# Patient Record
Sex: Female | Born: 1935 | Race: Black or African American | Hispanic: No | Smoking: Never smoker
Health system: Southern US, Community
[De-identification: ages and names within clinical notes are randomized; demographics above are authoritative.]

## PROBLEM LIST (undated history)

## (undated) DIAGNOSIS — I972 Postmastectomy lymphedema syndrome: Secondary | ICD-10-CM

## (undated) DIAGNOSIS — D649 Anemia, unspecified: Secondary | ICD-10-CM

## (undated) DIAGNOSIS — D709 Neutropenia, unspecified: Secondary | ICD-10-CM

## (undated) DIAGNOSIS — F32A Depression, unspecified: Secondary | ICD-10-CM

## (undated) DIAGNOSIS — K5790 Diverticulosis of intestine, part unspecified, without perforation or abscess without bleeding: Secondary | ICD-10-CM

## (undated) DIAGNOSIS — R609 Edema, unspecified: Secondary | ICD-10-CM

## (undated) DIAGNOSIS — I872 Venous insufficiency (chronic) (peripheral): Secondary | ICD-10-CM

## (undated) DIAGNOSIS — E785 Hyperlipidemia, unspecified: Secondary | ICD-10-CM

## (undated) DIAGNOSIS — K219 Gastro-esophageal reflux disease without esophagitis: Secondary | ICD-10-CM

## (undated) DIAGNOSIS — F329 Major depressive disorder, single episode, unspecified: Secondary | ICD-10-CM

## (undated) DIAGNOSIS — R945 Abnormal results of liver function studies: Secondary | ICD-10-CM

## (undated) DIAGNOSIS — K5909 Other constipation: Secondary | ICD-10-CM

## (undated) DIAGNOSIS — K449 Diaphragmatic hernia without obstruction or gangrene: Secondary | ICD-10-CM

## (undated) DIAGNOSIS — Z79899 Other long term (current) drug therapy: Secondary | ICD-10-CM

## (undated) DIAGNOSIS — R413 Other amnesia: Secondary | ICD-10-CM

## (undated) DIAGNOSIS — M109 Gout, unspecified: Secondary | ICD-10-CM

## (undated) DIAGNOSIS — I10 Essential (primary) hypertension: Secondary | ICD-10-CM

## (undated) DIAGNOSIS — C50919 Malignant neoplasm of unspecified site of unspecified female breast: Secondary | ICD-10-CM

## (undated) DIAGNOSIS — M949 Disorder of cartilage, unspecified: Secondary | ICD-10-CM

## (undated) DIAGNOSIS — E876 Hypokalemia: Secondary | ICD-10-CM

## (undated) DIAGNOSIS — R32 Unspecified urinary incontinence: Secondary | ICD-10-CM

## (undated) DIAGNOSIS — G4700 Insomnia, unspecified: Secondary | ICD-10-CM

## (undated) DIAGNOSIS — M899 Disorder of bone, unspecified: Secondary | ICD-10-CM

## (undated) DIAGNOSIS — I498 Other specified cardiac arrhythmias: Secondary | ICD-10-CM

## (undated) DIAGNOSIS — K5732 Diverticulitis of large intestine without perforation or abscess without bleeding: Secondary | ICD-10-CM

## (undated) DIAGNOSIS — E039 Hypothyroidism, unspecified: Secondary | ICD-10-CM

## (undated) HISTORY — DX: Gastro-esophageal reflux disease without esophagitis: K21.9

## (undated) HISTORY — DX: Diaphragmatic hernia without obstruction or gangrene: K44.9

## (undated) HISTORY — DX: Major depressive disorder, single episode, unspecified: F32.9

## (undated) HISTORY — DX: Malignant neoplasm of unspecified site of unspecified female breast: C50.919

## (undated) HISTORY — DX: Hypokalemia: E87.6

## (undated) HISTORY — DX: Anemia, unspecified: D64.9

## (undated) HISTORY — DX: Other specified cardiac arrhythmias: I49.8

## (undated) HISTORY — DX: Insomnia, unspecified: G47.00

## (undated) HISTORY — PX: VAGINAL HYSTERECTOMY: SHX2639

## (undated) HISTORY — DX: Other constipation: K59.09

## (undated) HISTORY — DX: Gout, unspecified: M10.9

## (undated) HISTORY — DX: Postmastectomy lymphedema syndrome: I97.2

## (undated) HISTORY — DX: Abnormal results of liver function studies: R94.5

## (undated) HISTORY — DX: Other long term (current) drug therapy: Z79.899

## (undated) HISTORY — PX: OTHER SURGICAL HISTORY: SHX169

## (undated) HISTORY — DX: Depression, unspecified: F32.A

## (undated) HISTORY — DX: Other amnesia: R41.3

## (undated) HISTORY — DX: Hyperlipidemia, unspecified: E78.5

## (undated) HISTORY — DX: Disorder of cartilage, unspecified: M94.9

## (undated) HISTORY — DX: Other disorders of bilirubin metabolism: E80.6

## (undated) HISTORY — DX: Diverticulosis of intestine, part unspecified, without perforation or abscess without bleeding: K57.90

## (undated) HISTORY — DX: Diverticulitis of large intestine without perforation or abscess without bleeding: K57.32

## (undated) HISTORY — DX: Essential (primary) hypertension: I10

## (undated) HISTORY — DX: Neutropenia, unspecified: D70.9

## (undated) HISTORY — PX: ABDOMINAL HYSTERECTOMY: SHX81

## (undated) HISTORY — DX: Unspecified urinary incontinence: R32

## (undated) HISTORY — DX: Venous insufficiency (chronic) (peripheral): I87.2

## (undated) HISTORY — DX: Disorder of bone, unspecified: M89.9

## (undated) HISTORY — DX: Edema, unspecified: R60.9

## (undated) HISTORY — DX: Hypothyroidism, unspecified: E03.9

---

## 1998-02-20 ENCOUNTER — Other Ambulatory Visit: Admission: RE | Admit: 1998-02-20 | Discharge: 1998-02-20 | Payer: Self-pay | Admitting: Internal Medicine

## 1998-05-11 ENCOUNTER — Ambulatory Visit (HOSPITAL_COMMUNITY): Admission: RE | Admit: 1998-05-11 | Discharge: 1998-05-11 | Payer: Self-pay | Admitting: Internal Medicine

## 1998-10-06 DIAGNOSIS — C50919 Malignant neoplasm of unspecified site of unspecified female breast: Secondary | ICD-10-CM

## 1998-10-06 HISTORY — DX: Malignant neoplasm of unspecified site of unspecified female breast: C50.919

## 1998-11-29 ENCOUNTER — Encounter: Payer: Self-pay | Admitting: Internal Medicine

## 1998-11-29 ENCOUNTER — Ambulatory Visit (HOSPITAL_COMMUNITY): Admission: RE | Admit: 1998-11-29 | Discharge: 1998-11-29 | Payer: Self-pay | Admitting: Internal Medicine

## 1999-07-16 ENCOUNTER — Other Ambulatory Visit: Admission: RE | Admit: 1999-07-16 | Discharge: 1999-07-16 | Payer: Self-pay | Admitting: Internal Medicine

## 1999-09-04 ENCOUNTER — Other Ambulatory Visit: Admission: RE | Admit: 1999-09-04 | Discharge: 1999-09-04 | Payer: Self-pay | Admitting: Radiology

## 1999-09-16 ENCOUNTER — Ambulatory Visit (HOSPITAL_COMMUNITY): Admission: RE | Admit: 1999-09-16 | Discharge: 1999-09-16 | Payer: Self-pay | Admitting: General Surgery

## 1999-09-16 ENCOUNTER — Encounter (HOSPITAL_BASED_OUTPATIENT_CLINIC_OR_DEPARTMENT_OTHER): Payer: Self-pay | Admitting: General Surgery

## 1999-09-17 ENCOUNTER — Ambulatory Visit (HOSPITAL_COMMUNITY): Admission: RE | Admit: 1999-09-17 | Discharge: 1999-09-17 | Payer: Self-pay | Admitting: General Surgery

## 1999-09-17 ENCOUNTER — Encounter (HOSPITAL_BASED_OUTPATIENT_CLINIC_OR_DEPARTMENT_OTHER): Payer: Self-pay | Admitting: General Surgery

## 1999-09-23 ENCOUNTER — Encounter (HOSPITAL_BASED_OUTPATIENT_CLINIC_OR_DEPARTMENT_OTHER): Payer: Self-pay | Admitting: General Surgery

## 1999-09-25 ENCOUNTER — Encounter (INDEPENDENT_AMBULATORY_CARE_PROVIDER_SITE_OTHER): Payer: Self-pay

## 1999-09-25 ENCOUNTER — Ambulatory Visit (HOSPITAL_COMMUNITY): Admission: RE | Admit: 1999-09-25 | Discharge: 1999-09-26 | Payer: Self-pay | Admitting: General Surgery

## 1999-10-14 ENCOUNTER — Encounter: Admission: RE | Admit: 1999-10-14 | Discharge: 1999-11-08 | Payer: Self-pay | Admitting: General Surgery

## 1999-11-01 ENCOUNTER — Encounter: Admission: RE | Admit: 1999-11-01 | Discharge: 1999-11-01 | Payer: Self-pay | Admitting: Hematology and Oncology

## 1999-11-01 ENCOUNTER — Encounter: Payer: Self-pay | Admitting: Hematology and Oncology

## 2000-10-13 ENCOUNTER — Ambulatory Visit (HOSPITAL_COMMUNITY): Admission: RE | Admit: 2000-10-13 | Discharge: 2000-10-13 | Payer: Self-pay | Admitting: Hematology and Oncology

## 2000-10-13 ENCOUNTER — Encounter: Payer: Self-pay | Admitting: Hematology and Oncology

## 2002-03-17 ENCOUNTER — Encounter: Payer: Self-pay | Admitting: Hematology and Oncology

## 2002-03-17 ENCOUNTER — Ambulatory Visit (HOSPITAL_COMMUNITY): Admission: RE | Admit: 2002-03-17 | Discharge: 2002-03-17 | Payer: Self-pay | Admitting: Hematology and Oncology

## 2002-04-21 ENCOUNTER — Encounter: Payer: Self-pay | Admitting: Internal Medicine

## 2002-04-21 ENCOUNTER — Ambulatory Visit (HOSPITAL_COMMUNITY): Admission: RE | Admit: 2002-04-21 | Discharge: 2002-04-21 | Payer: Self-pay | Admitting: Internal Medicine

## 2002-09-13 ENCOUNTER — Encounter: Payer: Self-pay | Admitting: Internal Medicine

## 2002-09-13 ENCOUNTER — Ambulatory Visit (HOSPITAL_COMMUNITY): Admission: RE | Admit: 2002-09-13 | Discharge: 2002-09-13 | Payer: Self-pay | Admitting: Internal Medicine

## 2004-09-27 ENCOUNTER — Ambulatory Visit: Payer: Self-pay | Admitting: Oncology

## 2005-10-22 ENCOUNTER — Ambulatory Visit: Payer: Self-pay | Admitting: Oncology

## 2006-01-01 ENCOUNTER — Encounter: Admission: RE | Admit: 2006-01-01 | Discharge: 2006-01-01 | Payer: Self-pay | Admitting: *Deleted

## 2006-10-06 HISTORY — PX: MASTECTOMY: SHX3

## 2006-10-19 ENCOUNTER — Ambulatory Visit: Payer: Self-pay | Admitting: Oncology

## 2006-10-21 LAB — CBC WITH DIFFERENTIAL/PLATELET
EOS%: 5.1 % (ref 0.0–7.0)
MCH: 28.1 pg (ref 26.0–34.0)
MCV: 85.5 fL (ref 81.0–101.0)
MONO%: 13.8 % — ABNORMAL HIGH (ref 0.0–13.0)
RBC: 4.56 10*6/uL (ref 3.70–5.32)
RDW: 15 % — ABNORMAL HIGH (ref 11.3–14.5)

## 2006-10-21 LAB — COMPREHENSIVE METABOLIC PANEL
AST: 20 U/L (ref 0–37)
Albumin: 3.8 g/dL (ref 3.5–5.2)
Alkaline Phosphatase: 95 U/L (ref 39–117)
BUN: 12 mg/dL (ref 6–23)
Potassium: 3.6 mEq/L (ref 3.5–5.3)
Sodium: 145 mEq/L (ref 135–145)
Total Protein: 6.2 g/dL (ref 6.0–8.3)

## 2007-03-16 ENCOUNTER — Encounter: Admission: RE | Admit: 2007-03-16 | Discharge: 2007-03-29 | Payer: Self-pay | Admitting: Oncology

## 2007-10-27 ENCOUNTER — Ambulatory Visit: Payer: Self-pay | Admitting: Oncology

## 2007-10-29 LAB — CBC WITH DIFFERENTIAL/PLATELET
BASO%: 0.3 % (ref 0.0–2.0)
Basophils Absolute: 0 10*3/uL (ref 0.0–0.1)
HCT: 38 % (ref 34.8–46.6)
LYMPH%: 25.9 % (ref 14.0–48.0)
MCHC: 33.8 g/dL (ref 32.0–36.0)
MONO#: 0.5 10*3/uL (ref 0.1–0.9)
NEUT%: 55.1 % (ref 39.6–76.8)
Platelets: 229 10*3/uL (ref 145–400)
WBC: 3.6 10*3/uL — ABNORMAL LOW (ref 3.9–10.0)

## 2007-10-29 LAB — COMPREHENSIVE METABOLIC PANEL
ALT: 20 U/L (ref 0–35)
BUN: 7 mg/dL (ref 6–23)
CO2: 27 mEq/L (ref 19–32)
Creatinine, Ser: 0.94 mg/dL (ref 0.40–1.20)
Glucose, Bld: 84 mg/dL (ref 70–99)
Total Bilirubin: 1.5 mg/dL — ABNORMAL HIGH (ref 0.3–1.2)

## 2007-10-29 LAB — LACTATE DEHYDROGENASE: LDH: 140 U/L (ref 94–250)

## 2008-06-26 ENCOUNTER — Encounter: Payer: Self-pay | Admitting: Gastroenterology

## 2008-07-30 ENCOUNTER — Emergency Department (HOSPITAL_COMMUNITY): Admission: EM | Admit: 2008-07-30 | Discharge: 2008-07-30 | Payer: Self-pay | Admitting: Emergency Medicine

## 2008-08-15 ENCOUNTER — Ambulatory Visit: Payer: Self-pay | Admitting: Gastroenterology

## 2008-08-15 DIAGNOSIS — K219 Gastro-esophageal reflux disease without esophagitis: Secondary | ICD-10-CM

## 2008-08-15 DIAGNOSIS — R634 Abnormal weight loss: Secondary | ICD-10-CM

## 2008-08-15 DIAGNOSIS — R1032 Left lower quadrant pain: Secondary | ICD-10-CM

## 2008-09-15 ENCOUNTER — Ambulatory Visit: Payer: Self-pay | Admitting: Gastroenterology

## 2008-09-15 ENCOUNTER — Encounter: Payer: Self-pay | Admitting: Gastroenterology

## 2008-09-18 ENCOUNTER — Encounter: Payer: Self-pay | Admitting: Gastroenterology

## 2008-10-17 ENCOUNTER — Ambulatory Visit: Payer: Self-pay | Admitting: Gastroenterology

## 2008-10-17 DIAGNOSIS — K2990 Gastroduodenitis, unspecified, without bleeding: Secondary | ICD-10-CM

## 2008-10-17 DIAGNOSIS — K2970 Gastritis, unspecified, without bleeding: Secondary | ICD-10-CM | POA: Insufficient documentation

## 2008-10-26 ENCOUNTER — Ambulatory Visit: Payer: Self-pay | Admitting: Oncology

## 2008-10-30 LAB — COMPREHENSIVE METABOLIC PANEL
ALT: 17 U/L (ref 0–35)
AST: 19 U/L (ref 0–37)
Albumin: 3.7 g/dL (ref 3.5–5.2)
BUN: 11 mg/dL (ref 6–23)
Calcium: 8.8 mg/dL (ref 8.4–10.5)
Chloride: 107 mEq/L (ref 96–112)
Potassium: 3.6 mEq/L (ref 3.5–5.3)

## 2008-10-30 LAB — CBC WITH DIFFERENTIAL/PLATELET
Basophils Absolute: 0 10*3/uL (ref 0.0–0.1)
EOS%: 4.1 % (ref 0.0–7.0)
HCT: 38.6 % (ref 34.8–46.6)
HGB: 13.1 g/dL (ref 11.6–15.9)
LYMPH%: 29.5 % (ref 14.0–48.0)
MCH: 29.1 pg (ref 26.0–34.0)
NEUT%: 53.9 % (ref 39.6–76.8)
Platelets: 223 10*3/uL (ref 145–400)
lymph#: 1.1 10*3/uL (ref 0.9–3.3)

## 2008-10-30 LAB — MORPHOLOGY: PLT EST: ADEQUATE

## 2009-03-14 ENCOUNTER — Emergency Department (HOSPITAL_COMMUNITY): Admission: EM | Admit: 2009-03-14 | Discharge: 2009-03-15 | Payer: Self-pay | Admitting: Emergency Medicine

## 2009-03-21 ENCOUNTER — Emergency Department (HOSPITAL_COMMUNITY): Admission: EM | Admit: 2009-03-21 | Discharge: 2009-03-21 | Payer: Self-pay | Admitting: Emergency Medicine

## 2009-05-25 ENCOUNTER — Encounter: Admission: RE | Admit: 2009-05-25 | Discharge: 2009-05-25 | Payer: Self-pay | Admitting: Internal Medicine

## 2009-06-01 ENCOUNTER — Encounter: Admission: RE | Admit: 2009-06-01 | Discharge: 2009-06-01 | Payer: Self-pay | Admitting: Internal Medicine

## 2009-10-24 ENCOUNTER — Ambulatory Visit: Payer: Self-pay | Admitting: Oncology

## 2009-10-26 LAB — CBC WITH DIFFERENTIAL/PLATELET
BASO%: 0.3 % (ref 0.0–2.0)
Basophils Absolute: 0 10*3/uL (ref 0.0–0.1)
Eosinophils Absolute: 0.2 10*3/uL (ref 0.0–0.5)
HGB: 13.1 g/dL (ref 11.6–15.9)
MCH: 27.2 pg (ref 25.1–34.0)
MCHC: 32.3 g/dL (ref 31.5–36.0)
MCV: 84.2 fL (ref 79.5–101.0)
MONO#: 0.5 10*3/uL (ref 0.1–0.9)
MONO%: 13 % (ref 0.0–14.0)
RDW: 15.2 % — ABNORMAL HIGH (ref 11.2–14.5)
lymph#: 1.2 10*3/uL (ref 0.9–3.3)

## 2009-10-26 LAB — COMPREHENSIVE METABOLIC PANEL
Alkaline Phosphatase: 69 U/L (ref 39–117)
CO2: 27 mEq/L (ref 19–32)
Calcium: 9.2 mg/dL (ref 8.4–10.5)
Chloride: 106 mEq/L (ref 96–112)
Sodium: 143 mEq/L (ref 135–145)
Total Protein: 6.3 g/dL (ref 6.0–8.3)

## 2010-05-25 LAB — HM DEXA SCAN

## 2011-01-13 LAB — POTASSIUM
Potassium: 2.7 mEq/L — CL (ref 3.5–5.1)
Potassium: 3 mEq/L — ABNORMAL LOW (ref 3.5–5.1)

## 2011-01-13 LAB — POCT I-STAT, CHEM 8
BUN: 9 mg/dL (ref 6–23)
Calcium, Ion: 1.06 mmol/L — ABNORMAL LOW (ref 1.12–1.32)
Chloride: 103 mEq/L (ref 96–112)
Creatinine, Ser: 1.3 mg/dL — ABNORMAL HIGH (ref 0.4–1.2)
Glucose, Bld: 167 mg/dL — ABNORMAL HIGH (ref 70–99)
HCT: 40 % (ref 36.0–46.0)
Hemoglobin: 13.6 g/dL (ref 12.0–15.0)
Potassium: 2.7 mEq/L — CL (ref 3.5–5.1)
Sodium: 141 mEq/L (ref 135–145)

## 2011-01-13 LAB — DIFFERENTIAL
Basophils Relative: 0 % (ref 0–1)
Eosinophils Absolute: 0.2 10*3/uL (ref 0.0–0.7)
Lymphocytes Relative: 37 % (ref 12–46)
Neutrophils Relative %: 47 % (ref 43–77)

## 2011-01-13 LAB — CBC: Hemoglobin: 13.1 g/dL (ref 12.0–15.0)

## 2011-01-13 LAB — PROTIME-INR: Prothrombin Time: 13.3 seconds (ref 11.6–15.2)

## 2011-01-13 LAB — TYPE AND SCREEN: Antibody Screen: NEGATIVE

## 2011-02-07 ENCOUNTER — Emergency Department (HOSPITAL_COMMUNITY)
Admission: EM | Admit: 2011-02-07 | Discharge: 2011-02-08 | Disposition: A | Discharge status: Home / Self Care | Payer: Medicare Other | Attending: Emergency Medicine | Admitting: Emergency Medicine

## 2011-02-07 ENCOUNTER — Emergency Department (HOSPITAL_COMMUNITY): Payer: Medicare Other

## 2011-02-07 DIAGNOSIS — R1110 Vomiting, unspecified: Secondary | ICD-10-CM | POA: Insufficient documentation

## 2011-02-07 DIAGNOSIS — K5900 Constipation, unspecified: Secondary | ICD-10-CM | POA: Insufficient documentation

## 2011-02-07 DIAGNOSIS — Z853 Personal history of malignant neoplasm of breast: Secondary | ICD-10-CM | POA: Insufficient documentation

## 2011-02-07 DIAGNOSIS — I10 Essential (primary) hypertension: Secondary | ICD-10-CM | POA: Insufficient documentation

## 2011-02-07 DIAGNOSIS — R109 Unspecified abdominal pain: Secondary | ICD-10-CM | POA: Insufficient documentation

## 2011-02-07 LAB — OCCULT BLOOD, POC DEVICE: Fecal Occult Bld: NEGATIVE

## 2011-03-07 ENCOUNTER — Ambulatory Visit (INDEPENDENT_AMBULATORY_CARE_PROVIDER_SITE_OTHER): Payer: Medicare Other | Admitting: Gastroenterology

## 2011-03-07 ENCOUNTER — Encounter: Payer: Self-pay | Admitting: Gastroenterology

## 2011-03-07 VITALS — BP 144/68 | HR 76 | Ht 65.5 in | Wt 132.2 lb

## 2011-03-07 DIAGNOSIS — K5900 Constipation, unspecified: Secondary | ICD-10-CM

## 2011-03-07 NOTE — Progress Notes (Signed)
History of Present Illness: This is an 77 year old female last seen in the past. She is by herself today and relates problems with worsening constipation that has not been helped by recommended laxatives and stool softeners. She has a long history of constipation however her symptoms have gradually worsened over the past year. She can provide limited history. I have office notes over several months from her referring physician describing ongoing problems with constipation. She states that she has tried MiraLax and sorbitol on an as-needed basis but has not used these medications on a scheduled regimen. Stool softener has also been tried. She uses enemas on occasion. She states she had a colonoscopy in the 1990s that was normal. Recent abdominal film shows retained stool and gas. Recent rectal examination and stool Hemoccults were negative by her primary physician. She denies change in stool caliber, melena, hematochezia, abdominal pain, nausea, vomiting.   Current Medications, Allergies, Past Medical History, Past Surgical History, Family History and Social History were reviewed in [employer] record.  Physical Exam: General: Well developed , well nourished,  elderly female, no acute distress Head: Normocephalic and atraumatic Eyes:  sclerae anicteric, EOMI Ears: Normal auditory acuity Mouth: No deformity or lesions Lungs: Clear throughout to auscultation Heart: Regular rate and rhythm; no murmurs, rubs or bruits Abdomen: Soft, non tender and non distended. No masses, hepatosplenomegaly or hernias noted. Normal Bowel sounds Rectal: deferred with exam on 02/05/2011 her primary physician showing no lesions, normal sphincter tone, no masses and Hemoccult negative hard stool in the vault and  Musculoskeletal: Symmetrical with no gross deformities  Pulses:  Normal pulses noted Extremities: No clubbing, cyanosis, edema or deformities noted Neurological: Alert oriented x 4, grossly  nonfocal Psychological:  Alert and cooperative. Normal mood and affect  Assessment and Recommendations:  1. Gradually worsening chronic constipation with Hemoccult negative stool and no lesions on rectal examination. Abdominal films consistent with constipation as well. She would be best served by a regularly scheduled laxative regimen. Begin with MiraLax twice daily may titrate up to four times daily or down to once daily for adequate relief of constipation. She may use sorbitol and enemas as needed if the above scheduled Miralax regimen is not successful. If her constipation cannot be adequately controlled consider a barium enema or colonoscopy however the patient would like to avoid a colonoscopy or barium enema, if at all possible, and I feel this is reasonable.

## 2011-03-07 NOTE — Patient Instructions (Addendum)
Start taking Miralax 17 grams in 8 oz of water twice daily every day. Continue Sorbitol solution as needed for constipation. cc: Providence Crosby, MD

## 2011-05-26 ENCOUNTER — Other Ambulatory Visit: Payer: Self-pay | Admitting: Internal Medicine

## 2011-05-26 DIAGNOSIS — G8929 Other chronic pain: Secondary | ICD-10-CM

## 2011-05-26 DIAGNOSIS — R634 Abnormal weight loss: Secondary | ICD-10-CM

## 2011-05-26 DIAGNOSIS — R109 Unspecified abdominal pain: Secondary | ICD-10-CM

## 2011-05-26 DIAGNOSIS — E039 Hypothyroidism, unspecified: Secondary | ICD-10-CM

## 2011-05-29 ENCOUNTER — Ambulatory Visit
Admission: RE | Admit: 2011-05-29 | Discharge: 2011-05-29 | Disposition: A | Discharge status: Home / Self Care | Payer: Medicare Other | Source: Ambulatory Visit | Attending: Internal Medicine | Admitting: Internal Medicine

## 2011-05-29 ENCOUNTER — Other Ambulatory Visit: Payer: Self-pay | Admitting: Internal Medicine

## 2011-05-29 DIAGNOSIS — R188 Other ascites: Secondary | ICD-10-CM

## 2011-05-29 DIAGNOSIS — G8929 Other chronic pain: Secondary | ICD-10-CM

## 2011-05-29 DIAGNOSIS — R109 Unspecified abdominal pain: Secondary | ICD-10-CM

## 2011-05-29 DIAGNOSIS — E039 Hypothyroidism, unspecified: Secondary | ICD-10-CM

## 2011-05-29 DIAGNOSIS — R634 Abnormal weight loss: Secondary | ICD-10-CM

## 2011-06-05 ENCOUNTER — Ambulatory Visit
Admission: RE | Admit: 2011-06-05 | Discharge: 2011-06-05 | Disposition: A | Discharge status: Home / Self Care | Payer: Medicare Other | Source: Ambulatory Visit | Attending: Internal Medicine | Admitting: Internal Medicine

## 2011-06-05 DIAGNOSIS — R188 Other ascites: Secondary | ICD-10-CM

## 2011-06-05 MED ORDER — GADOBENATE DIMEGLUMINE 529 MG/ML IV SOLN
12.0000 mL | Freq: Once | INTRAVENOUS | Status: AC | PRN
  Administered 2011-06-05: 12 mL via INTRAVENOUS

## 2011-07-08 LAB — URINALYSIS, ROUTINE W REFLEX MICROSCOPIC
Bilirubin Urine: NEGATIVE
Glucose, UA: NEGATIVE
Hgb urine dipstick: NEGATIVE
Ketones, ur: NEGATIVE
Nitrite: NEGATIVE
Protein, ur: NEGATIVE
Specific Gravity, Urine: 1.029
Urobilinogen, UA: 0.2
pH: 6

## 2011-07-08 LAB — COMPREHENSIVE METABOLIC PANEL
ALT: 27
Alkaline Phosphatase: 70
BUN: 11
CO2: 26
Chloride: 104
Glucose, Bld: 113 — ABNORMAL HIGH
Potassium: 3.2 — ABNORMAL LOW
Sodium: 138
Total Bilirubin: 1

## 2011-07-08 LAB — URINE CULTURE

## 2011-07-08 LAB — COMPREHENSIVE METABOLIC PANEL WITH GFR
AST: 31
Albumin: 3.5
Calcium: 9.2
Creatinine, Ser: 0.99
GFR calc Af Amer: >60
GFR calc non Af Amer: 53 — ABNORMAL LOW
Total Protein: 6.4

## 2011-07-08 LAB — CBC
HCT: 41
Hemoglobin: 13.8
MCHC: 33.6
MCV: 85.7
Platelets: 211
RBC: 4.79
RDW: 15.4
WBC: 4.1

## 2011-07-08 LAB — DIFFERENTIAL
Basophils Absolute: 0
Basophils Relative: 1
Eosinophils Absolute: 0.1
Eosinophils Relative: 3
Lymphocytes Relative: 28
Lymphs Abs: 1.1
Monocytes Absolute: 0.4
Monocytes Relative: 9
Neutro Abs: 2.4
Neutrophils Relative %: 60

## 2011-07-08 LAB — LIPASE, BLOOD: Lipase: 22

## 2011-07-08 LAB — URINE MICROSCOPIC-ADD ON

## 2011-08-05 ENCOUNTER — Other Ambulatory Visit: Payer: Self-pay | Admitting: Gastroenterology

## 2011-08-08 ENCOUNTER — Ambulatory Visit
Admission: RE | Admit: 2011-08-08 | Discharge: 2011-08-08 | Disposition: A | Discharge status: Home / Self Care | Payer: Medicare Other | Source: Ambulatory Visit | Attending: Gastroenterology | Admitting: Gastroenterology

## 2011-08-08 MED ORDER — IOHEXOL 300 MG/ML  SOLN
100.0000 mL | Freq: Once | INTRAMUSCULAR | Status: AC | PRN
  Administered 2011-08-08: 100 mL via INTRAVENOUS

## 2011-08-25 ENCOUNTER — Other Ambulatory Visit: Payer: Self-pay | Admitting: Endocrinology

## 2011-08-25 DIAGNOSIS — E049 Nontoxic goiter, unspecified: Secondary | ICD-10-CM

## 2011-08-27 ENCOUNTER — Ambulatory Visit
Admission: RE | Admit: 2011-08-27 | Discharge: 2011-08-27 | Disposition: A | Discharge status: Home / Self Care | Payer: Medicare Other | Source: Ambulatory Visit | Attending: Endocrinology | Admitting: Endocrinology

## 2011-08-27 DIAGNOSIS — E049 Nontoxic goiter, unspecified: Secondary | ICD-10-CM

## 2011-09-17 ENCOUNTER — Other Ambulatory Visit: Payer: Self-pay | Admitting: Endocrinology

## 2011-09-17 DIAGNOSIS — E049 Nontoxic goiter, unspecified: Secondary | ICD-10-CM

## 2011-11-06 ENCOUNTER — Telehealth: Payer: Self-pay | Admitting: *Deleted

## 2011-11-06 NOTE — Telephone Encounter (Signed)
Faxed  Order from for non sillicone breast form to [facility].

## 2011-12-03 ENCOUNTER — Other Ambulatory Visit: Payer: Self-pay | Admitting: Internal Medicine

## 2011-12-05 ENCOUNTER — Other Ambulatory Visit: Payer: Medicare Other

## 2011-12-11 ENCOUNTER — Ambulatory Visit
Admission: RE | Admit: 2011-12-11 | Discharge: 2011-12-11 | Disposition: A | Discharge status: Home / Self Care | Payer: Medicare Other | Source: Ambulatory Visit | Attending: Internal Medicine | Admitting: Internal Medicine

## 2012-02-16 ENCOUNTER — Other Ambulatory Visit: Payer: Medicare Other

## 2012-09-13 ENCOUNTER — Other Ambulatory Visit: Payer: Self-pay

## 2012-09-13 ENCOUNTER — Emergency Department (INDEPENDENT_AMBULATORY_CARE_PROVIDER_SITE_OTHER): Payer: Medicare Other

## 2012-09-13 ENCOUNTER — Encounter (HOSPITAL_COMMUNITY): Payer: Self-pay | Admitting: Emergency Medicine

## 2012-09-13 ENCOUNTER — Emergency Department (INDEPENDENT_AMBULATORY_CARE_PROVIDER_SITE_OTHER)
Admission: EM | Admit: 2012-09-13 | Discharge: 2012-09-13 | Disposition: A | Discharge status: Home / Self Care | Payer: Medicare Other | Source: Home / Self Care | Attending: Emergency Medicine | Admitting: Emergency Medicine

## 2012-09-13 DIAGNOSIS — R05 Cough: Secondary | ICD-10-CM

## 2012-09-13 DIAGNOSIS — T44905A Adverse effect of unspecified drugs primarily affecting the autonomic nervous system, initial encounter: Secondary | ICD-10-CM

## 2012-09-13 DIAGNOSIS — E876 Hypokalemia: Secondary | ICD-10-CM

## 2012-09-13 DIAGNOSIS — R058 Other specified cough: Secondary | ICD-10-CM

## 2012-09-13 LAB — POCT I-STAT, CHEM 8
Calcium, Ion: 1.23 mmol/L (ref 1.13–1.30)
Chloride: 102 mEq/L (ref 96–112)
HCT: 45 % (ref 36.0–46.0)
Potassium: 3.3 mEq/L — ABNORMAL LOW (ref 3.5–5.1)
Sodium: 143 mEq/L (ref 135–145)

## 2012-09-13 MED ORDER — POTASSIUM CHLORIDE CRYS ER 10 MEQ PO TBCR: 10.0000 meq | EXTENDED_RELEASE_TABLET | Freq: Two times a day (BID) | ORAL | Status: DC

## 2012-09-13 MED ORDER — BENZONATATE 200 MG PO CAPS: 200.0000 mg | ORAL_CAPSULE | Freq: Three times a day (TID) | ORAL | Status: DC | PRN

## 2012-09-13 NOTE — ED Notes (Signed)
Pt is here for persistent cough x2 months... Sx include: chest discomfort due to cough... Denies: fevers, vomiting, nauseas, diarrhea... She is alert w/no signs of distress.

## 2012-09-13 NOTE — ED Provider Notes (Signed)
Chief Complaint  Patient presents with  . Cough    History of Present Illness:   The patient is an 78 year old female who has had a 2 month history of a dry cough. She occasionally has a little bit of chest tightness and describes some wheezing. She is mildly short of breath with exertion. She denies any chest pain except she has some upper sternal pain whenever she coughs. She has some rhinorrhea and postnasal drip. She denies any nasal congestion, itching of the nose, sneezing, or itchy, watery eyes. She's had no postnasal drainage. She denies any history of asthma. She has had no indigestion, reflux, waterbrash. She denies any ankle swelling, PND, orthopnea. There is no history of cardiac disease. The patient was started on lisinopril about 3 months ago.  Review of Systems:  Other than noted above, the patient denies any of the following symptoms: Systemic:  No fevers, chills, sweats, weight loss or gain, fatigue, or tiredness. ENT:  No nasal congestion, sneezing, itching, postnasal drip, sinus pressure, headache, sore throat, or hoarseness. Lungs:  No wheezing, shortness of breath, chest tightness or congestion. Heart:  No chest pain, tightness, pressure, PND, orthopnea, or ankle edema. GI:  No indigestion, heartburn, waterbrash, burping, abdominal pain, nausea, or vomiting.  PMFSH:  Past medical history, family history, social history, meds, and allergies were reviewed.  Specifically, there is no history of asthma, allergies, reflux esophagitis or cigarette smoking.   Physical Exam:   Vital signs:  BP 171/89  Pulse 88  Temp 97.2 F (36.2 C) (Oral)  Resp 20  SpO2 100% General:  Alert and oriented.  In no distress.  Skin warm and dry. ENT: TMs and ear canals normal.  Nasal mucosa normal, without drainage.  Pharynx clear without exudate or drainage.  No intraoral lesions. Neck:  No adenopathy, tenderness or mass.  No JVD. Lungs:  No respiratory distress.  Breath sounds clear and equal  bilaterally.  No wheezes, rales or rhonchi. Heart:  Regular rhythm, no gallops or murmers.  No pedal edema. Abdomon:  Soft and nontender.  No organomegaly or mass.  Results for orders placed during the hospital encounter of 09/13/12  POCT I-STAT, CHEM 8      Component Value Range   Sodium 143  135 - 145 mEq/L   Potassium 3.3 (*) 3.5 - 5.1 mEq/L   Chloride 102  96 - 112 mEq/L   BUN 11  6 - 23 mg/dL   Creatinine, Ser 4.09  0.50 - 1.10 mg/dL   Glucose, Bld 91  70 - 99 mg/dL   Calcium, Ion 8.11  9.14 - 1.30 mmol/L   TCO2 29  0 - 100 mmol/L   Hemoglobin 15.3 (*) 12.0 - 15.0 g/dL   HCT 78.2  95.6 - 21.3 %   Radiology:  Dg Chest 2 View  09/13/2012  *RADIOLOGY REPORT*  Clinical Data: Cough, shortness of breath  CHEST - 2 VIEW  Comparison: None.  Findings: The lungs are essentially clear.  No focal consolidation. No pleural effusion or pneumothorax.  The heart is top normal in size.  Visualized osseous structures are within normal limits.  Postsurgical changes in the left upper chest wall/breast.  IMPRESSION: No evidence of acute cardiopulmonary disease.   Original Report Authenticated By: Charline Bills, M.D.     Date: 09/13/2012  Rate: 72  Rhythm: sinus arrhythmia  QRS Axis: normal  Intervals: normal  ST/T Wave abnormalities: normal  Conduction Disutrbances:none  Narrative Interpretation: Sinus rhythm with premature atrial complexes, minor  nonspecific ST T wave changes which are unchanged since last tracing, since last tracing PACs are seen.  Old EKG Reviewed: unchanged  Assessment:  The primary encounter diagnosis was Cough due to ACE inhibitor. A diagnosis of Hypokalemia was also pertinent to this visit.  Plan:   1.  The following meds were prescribed:   New Prescriptions   BENZONATATE (TESSALON) 200 MG CAPSULE    Take 1 capsule (200 mg total) by mouth 3 (three) times daily as needed for cough.   POTASSIUM CHLORIDE (KLOR-CON M10) 10 MEQ TABLET    Take 1 tablet (10 mEq total) by  mouth 2 (two) times daily.   The patient was told to stop her lisinopril and return again in 2 weeks for a followup on her blood pressure.  2.  The patient was instructed in symptomatic care and handouts were given. 3.  The patient was told to return if becoming worse in any way, if no better in 3 or 4 days, and given some red flag symptoms that would indicate earlier return.     Reuben Likes, MD 09/13/12 [phone]

## 2012-11-03 ENCOUNTER — Other Ambulatory Visit: Payer: Self-pay | Admitting: Internal Medicine

## 2012-11-03 DIAGNOSIS — R634 Abnormal weight loss: Secondary | ICD-10-CM

## 2012-11-03 DIAGNOSIS — N289 Disorder of kidney and ureter, unspecified: Secondary | ICD-10-CM

## 2012-11-03 DIAGNOSIS — R630 Anorexia: Secondary | ICD-10-CM

## 2012-11-08 ENCOUNTER — Ambulatory Visit
Admission: RE | Admit: 2012-11-08 | Discharge: 2012-11-08 | Disposition: A | Discharge status: Home / Self Care | Payer: Medicare Other | Source: Ambulatory Visit | Attending: Internal Medicine | Admitting: Internal Medicine

## 2012-11-08 DIAGNOSIS — N289 Disorder of kidney and ureter, unspecified: Secondary | ICD-10-CM

## 2012-11-08 DIAGNOSIS — R634 Abnormal weight loss: Secondary | ICD-10-CM

## 2012-11-08 DIAGNOSIS — R630 Anorexia: Secondary | ICD-10-CM

## 2012-11-25 LAB — HM MAMMOGRAPHY: HM Mammogram: NEGATIVE

## 2013-02-09 ENCOUNTER — Other Ambulatory Visit: Payer: Self-pay | Admitting: Internal Medicine

## 2013-02-24 ENCOUNTER — Encounter: Payer: Self-pay | Admitting: *Deleted

## 2013-02-24 ENCOUNTER — Ambulatory Visit: Payer: Medicare Other

## 2013-02-24 ENCOUNTER — Other Ambulatory Visit: Payer: Self-pay | Admitting: Geriatric Medicine

## 2013-02-24 DIAGNOSIS — E785 Hyperlipidemia, unspecified: Secondary | ICD-10-CM

## 2013-02-24 DIAGNOSIS — I10 Essential (primary) hypertension: Secondary | ICD-10-CM

## 2013-02-24 DIAGNOSIS — D649 Anemia, unspecified: Secondary | ICD-10-CM

## 2013-02-24 DIAGNOSIS — E039 Hypothyroidism, unspecified: Secondary | ICD-10-CM

## 2013-02-25 LAB — COMPREHENSIVE METABOLIC PANEL
ALT: 13 IU/L (ref 0–32)
AST: 35 IU/L (ref 0–40)
Albumin/Globulin Ratio: 1.5 (ref 1.1–2.5)
Albumin: 3.4 g/dL (ref 3.2–4.6)
Alkaline Phosphatase: 61 IU/L (ref 39–117)
BUN/Creatinine Ratio: 11 (ref 11–26)
BUN: 10 mg/dL (ref 10–36)
CO2: 24 mmol/L (ref 19–28)
Calcium: 8.9 mg/dL (ref 8.6–10.2)
Chloride: 110 mmol/L — ABNORMAL HIGH (ref 97–108)
Creatinine, Ser: 0.95 mg/dL (ref 0.57–1.00)
GFR calc Af Amer: 61 mL/min/{1.73_m2} (ref >59)
GFR calc non Af Amer: 53 mL/min/{1.73_m2} — ABNORMAL LOW (ref >59)
Globulin, Total: 2.2 g/dL (ref 1.5–4.5)
Glucose: 84 mg/dL (ref 65–99)
Potassium: 4 mmol/L (ref 3.5–5.2)
Sodium: 143 mmol/L (ref 134–144)
Total Bilirubin: 1.3 mg/dL — ABNORMAL HIGH (ref 0.0–1.2)
Total Protein: 5.6 g/dL — ABNORMAL LOW (ref 6.0–8.5)

## 2013-02-25 LAB — CBC WITH DIFFERENTIAL/PLATELET
Basophils Absolute: 0 10*3/uL (ref 0.0–0.2)
Basos: 0 % (ref 0–3)
Eos: 3 % (ref 0–5)
Eosinophils Absolute: 0.1 10*3/uL (ref 0.0–0.4)
HCT: 37.9 % (ref 34.0–46.6)
Hemoglobin: 12.6 g/dL (ref 11.1–15.9)
Immature Grans (Abs): 0 10*3/uL (ref 0.0–0.1)
Immature Granulocytes: 0 % (ref 0–2)
Lymphocytes Absolute: 1 10*3/uL (ref 0.7–3.1)
Lymphs: 36 % (ref 14–46)
MCH: 27.9 pg (ref 26.6–33.0)
MCHC: 33.2 g/dL (ref 31.5–35.7)
MCV: 84 fL (ref 79–97)
Monocytes Absolute: 0.3 10*3/uL (ref 0.1–0.9)
Monocytes: 11 % (ref 4–12)
Neutrophils Absolute: 1.3 10*3/uL — ABNORMAL LOW (ref 1.4–7.0)
Neutrophils Relative %: 50 % (ref 40–74)
RBC: 4.52 x10E6/uL (ref 3.77–5.28)
RDW: 16.2 % — ABNORMAL HIGH (ref 12.3–15.4)
WBC: 2.7 10*3/uL — ABNORMAL LOW (ref 3.4–10.8)

## 2013-02-25 LAB — LIPID PANEL
Chol/HDL Ratio: 2 ratio units (ref 0.0–4.4)
Cholesterol, Total: 131 mg/dL (ref 100–199)
HDL: 64 mg/dL (ref >39)
LDL Calculated: 58 mg/dL (ref 0–99)
Triglycerides: 47 mg/dL (ref 0–149)
VLDL Cholesterol Cal: 9 mg/dL (ref 5–40)

## 2013-02-25 LAB — PREALBUMIN: Prealbumin: 18 mg/dL — ABNORMAL LOW (ref 20–40)

## 2013-02-25 LAB — TSH: TSH: 1.18 u[IU]/mL (ref 0.450–4.500)

## 2013-03-01 ENCOUNTER — Encounter: Payer: Self-pay | Admitting: Internal Medicine

## 2013-03-01 ENCOUNTER — Ambulatory Visit (INDEPENDENT_AMBULATORY_CARE_PROVIDER_SITE_OTHER): Payer: Medicare Other | Admitting: Internal Medicine

## 2013-03-01 VITALS — BP 140/78 | HR 92 | Temp 97.7°F | Resp 15 | Ht 65.5 in | Wt 120.6 lb

## 2013-03-01 DIAGNOSIS — H9190 Unspecified hearing loss, unspecified ear: Secondary | ICD-10-CM | POA: Insufficient documentation

## 2013-03-01 DIAGNOSIS — I10 Essential (primary) hypertension: Secondary | ICD-10-CM

## 2013-03-01 DIAGNOSIS — F0390 Unspecified dementia without behavioral disturbance: Secondary | ICD-10-CM

## 2013-03-01 DIAGNOSIS — E039 Hypothyroidism, unspecified: Secondary | ICD-10-CM

## 2013-03-01 DIAGNOSIS — K219 Gastro-esophageal reflux disease without esophagitis: Secondary | ICD-10-CM

## 2013-03-01 DIAGNOSIS — J069 Acute upper respiratory infection, unspecified: Secondary | ICD-10-CM | POA: Insufficient documentation

## 2013-03-01 DIAGNOSIS — Z0000 Encounter for general adult medical examination without abnormal findings: Secondary | ICD-10-CM

## 2013-03-01 DIAGNOSIS — E785 Hyperlipidemia, unspecified: Secondary | ICD-10-CM

## 2013-03-01 DIAGNOSIS — M810 Age-related osteoporosis without current pathological fracture: Secondary | ICD-10-CM | POA: Insufficient documentation

## 2013-03-01 MED ORDER — AZITHROMYCIN 250 MG PO TABS: ORAL_TABLET | ORAL | Status: DC

## 2013-03-01 MED ORDER — GUAIFENESIN 100 MG/5ML PO LIQD: 200.0000 mg | Freq: Three times a day (TID) | ORAL | Status: DC | PRN

## 2013-03-01 NOTE — Progress Notes (Signed)
Subjective:    Patient ID: Kerry Wells, female    DOB: 1920-07-30, 79 y.o.   MRN: [id number]  HPI She has had cold symptoms for 2 weeks. Her cough is productive with yellowish green sputum. Has runny nose and sore throat. denies any fever or chills. Feels tired easily. Besides this she has been doing good overall Does not want any further mammogram or pelvic exam. Mammogram 2/14 was normal uptodate with her immunizations dexa from 2011 reviewed No falls, trauma reported Does not use any assistive device to walk  Review of Systems  Constitutional: Negative for fever, chills, diaphoresis and appetite change.  HENT: Positive for hearing loss, congestion and neck pain. Negative for ear pain and mouth sores.        Feels knots in her neck area  Eyes: Negative for visual disturbance.       Has corrective lenses. Last seen by eye doctor last week  Respiratory: Positive for cough. Negative for chest tightness and shortness of breath.   Cardiovascular: Negative for chest pain, palpitations and leg swelling.  Gastrointestinal: Negative for nausea, vomiting, abdominal pain, constipation, blood in stool and abdominal distention.       Ocassional blood mixed with stool  Genitourinary: Negative for dysuria, flank pain and pelvic pain.  Musculoskeletal: Negative for back pain, joint swelling and arthralgias.  Skin: Negative for rash and wound.  Neurological: Negative for dizziness, seizures, weakness and light-headedness.       Occassional headache relieved with tylenol and rest  Hematological: Negative for adenopathy.  Psychiatric/Behavioral: Negative for sleep disturbance and agitation.      Objective:   Physical Exam  Constitutional: She is oriented to person, place, and time. She appears well-developed. No distress.  HENT:  Head: Normocephalic and atraumatic.  Right Ear: External ear normal.  Left Ear: External ear normal.  Nose: Nose normal.  Mouth/Throat: Oropharynx is clear and  moist. No oropharyngeal exudate.  Eyes: Conjunctivae are normal. Pupils are equal, round, and reactive to light.  Neck: Normal range of motion. Neck supple. No JVD present. No tracheal deviation present.  Cardiovascular: Normal rate and regular rhythm.   No murmur heard. Pulmonary/Chest: Effort normal and breath sounds normal. No respiratory distress. She has no wheezes. She exhibits no tenderness.  Left mastectomy site well healed  Abdominal: Soft. Bowel sounds are normal. She exhibits no distension and no mass. There is no tenderness. There is no rebound and no guarding.  Genitourinary:  refused  Musculoskeletal: Normal range of motion. She exhibits no edema and no tenderness.  Lymphadenopathy:    She has no cervical adenopathy.  Neurological: She is alert and oriented to person, place, and time. She has normal reflexes. No cranial nerve deficit. Coordination normal.  Skin: Skin is warm and dry. No rash noted. She is not diaphoretic. No erythema.  Psychiatric: She has a normal mood and affect. Her behavior is normal.    BP 140/78  Pulse 92  Temp(Src) 97.7 F (36.5 C) (Oral)  Resp 15  Ht 5' 5.5" (1.664 m)  Wt 120 lb 9.6 oz (54.704 kg)  BMI 19.76 kg/m2  Labs reviewed- I have reviewed all lab results   Assessment & Plan:   Osteoporosis- taking alendronate once a week. Has been tolerating it well.no falls reported. Will monitor clinically for now  Hyperlipidemia- taking lipitor 10 mg daily, flp shows improved lipid level  Hypothyroidism- levothyroxine to be continued. Normal tsh. Monitor clinically  htn- bp well controlled. Continue lisinopril and on asa  gerd- continue prilosec  Urinary incontinence- ditropan has been helpful, her frequency has decreased- continue without changes  Hypokalemia- continue kcl supplement, recent bmp has normal k level  General exam- uptodate with immunization, mammogram, labs reviewed. Diet counselled. Not driving anymore. Lives with her  sons. fobt card provided.   Memory loss- has dementia and currently off all medications. She is able to carry out conversation and does not want any medication. stable  URI- will provide z pack and robitussin and reassess if no improvement

## 2013-03-01 NOTE — Progress Notes (Signed)
Failed Clock Drawing----given by Zalea Pete Gilchrist,CMA 

## 2013-03-10 ENCOUNTER — Other Ambulatory Visit: Payer: Self-pay | Admitting: Internal Medicine

## 2013-03-18 ENCOUNTER — Other Ambulatory Visit: Payer: Self-pay | Admitting: Geriatric Medicine

## 2013-03-18 MED ORDER — ALENDRONATE SODIUM 70 MG PO TABS: 70.0000 mg | ORAL_TABLET | ORAL | Status: DC

## 2013-04-14 ENCOUNTER — Other Ambulatory Visit: Payer: Self-pay | Admitting: Internal Medicine

## 2013-04-27 ENCOUNTER — Other Ambulatory Visit: Payer: Self-pay | Admitting: Internal Medicine

## 2013-06-15 ENCOUNTER — Other Ambulatory Visit: Payer: Self-pay | Admitting: Internal Medicine

## 2013-08-19 ENCOUNTER — Other Ambulatory Visit: Payer: Self-pay | Admitting: Internal Medicine

## 2013-08-23 ENCOUNTER — Encounter: Payer: Self-pay | Admitting: *Deleted

## 2014-02-04 ENCOUNTER — Encounter (HOSPITAL_COMMUNITY): Payer: Self-pay | Admitting: Emergency Medicine

## 2014-02-04 ENCOUNTER — Emergency Department (HOSPITAL_COMMUNITY): Payer: Medicare Other

## 2014-02-04 ENCOUNTER — Inpatient Hospital Stay (HOSPITAL_COMMUNITY)
Admission: EM | Admit: 2014-02-04 | Discharge: 2014-02-08 | DRG: 071 | Disposition: A | Discharge status: Home / Self Care | Payer: Medicare Other | Attending: Internal Medicine | Admitting: Internal Medicine

## 2014-02-04 DIAGNOSIS — I10 Essential (primary) hypertension: Secondary | ICD-10-CM

## 2014-02-04 DIAGNOSIS — G9389 Other specified disorders of brain: Secondary | ICD-10-CM | POA: Diagnosis present

## 2014-02-04 DIAGNOSIS — E785 Hyperlipidemia, unspecified: Secondary | ICD-10-CM | POA: Diagnosis present

## 2014-02-04 DIAGNOSIS — R634 Abnormal weight loss: Secondary | ICD-10-CM

## 2014-02-04 DIAGNOSIS — W19XXXA Unspecified fall, initial encounter: Secondary | ICD-10-CM | POA: Diagnosis present

## 2014-02-04 DIAGNOSIS — F3289 Other specified depressive episodes: Secondary | ICD-10-CM | POA: Diagnosis present

## 2014-02-04 DIAGNOSIS — R1032 Left lower quadrant pain: Secondary | ICD-10-CM

## 2014-02-04 DIAGNOSIS — K2970 Gastritis, unspecified, without bleeding: Secondary | ICD-10-CM

## 2014-02-04 DIAGNOSIS — F0390 Unspecified dementia without behavioral disturbance: Secondary | ICD-10-CM | POA: Diagnosis present

## 2014-02-04 DIAGNOSIS — R269 Unspecified abnormalities of gait and mobility: Secondary | ICD-10-CM | POA: Diagnosis present

## 2014-02-04 DIAGNOSIS — Z7982 Long term (current) use of aspirin: Secondary | ICD-10-CM

## 2014-02-04 DIAGNOSIS — M810 Age-related osteoporosis without current pathological fracture: Secondary | ICD-10-CM | POA: Diagnosis present

## 2014-02-04 DIAGNOSIS — K219 Gastro-esophageal reflux disease without esophagitis: Secondary | ICD-10-CM | POA: Diagnosis present

## 2014-02-04 DIAGNOSIS — Z0000 Encounter for general adult medical examination without abnormal findings: Secondary | ICD-10-CM

## 2014-02-04 DIAGNOSIS — G939 Disorder of brain, unspecified: Principal | ICD-10-CM

## 2014-02-04 DIAGNOSIS — F329 Major depressive disorder, single episode, unspecified: Secondary | ICD-10-CM | POA: Diagnosis present

## 2014-02-04 DIAGNOSIS — M109 Gout, unspecified: Secondary | ICD-10-CM | POA: Diagnosis present

## 2014-02-04 DIAGNOSIS — R930 Abnormal findings on diagnostic imaging of skull and head, not elsewhere classified: Secondary | ICD-10-CM

## 2014-02-04 DIAGNOSIS — Z681 Body mass index (BMI) 19 or less, adult: Secondary | ICD-10-CM

## 2014-02-04 DIAGNOSIS — S46909A Unspecified injury of unspecified muscle, fascia and tendon at shoulder and upper arm level, unspecified arm, initial encounter: Secondary | ICD-10-CM | POA: Diagnosis present

## 2014-02-04 DIAGNOSIS — I639 Cerebral infarction, unspecified: Secondary | ICD-10-CM

## 2014-02-04 DIAGNOSIS — S4980XA Other specified injuries of shoulder and upper arm, unspecified arm, initial encounter: Secondary | ICD-10-CM | POA: Diagnosis present

## 2014-02-04 DIAGNOSIS — H9190 Unspecified hearing loss, unspecified ear: Secondary | ICD-10-CM

## 2014-02-04 DIAGNOSIS — K2990 Gastroduodenitis, unspecified, without bleeding: Secondary | ICD-10-CM

## 2014-02-04 DIAGNOSIS — Z853 Personal history of malignant neoplasm of breast: Secondary | ICD-10-CM

## 2014-02-04 DIAGNOSIS — J069 Acute upper respiratory infection, unspecified: Secondary | ICD-10-CM

## 2014-02-04 DIAGNOSIS — Z9010 Acquired absence of unspecified breast and nipple: Secondary | ICD-10-CM

## 2014-02-04 DIAGNOSIS — E039 Hypothyroidism, unspecified: Secondary | ICD-10-CM

## 2014-02-04 LAB — BASIC METABOLIC PANEL
BUN: 12 mg/dL (ref 6–23)
CO2: 27 mEq/L (ref 19–32)
Calcium: 8.8 mg/dL (ref 8.4–10.5)
Chloride: 103 mEq/L (ref 96–112)
Creatinine, Ser: 0.95 mg/dL (ref 0.50–1.10)
GFR, EST AFRICAN AMERICAN: 59 mL/min — AB (ref >90)
GFR, EST NON AFRICAN AMERICAN: 51 mL/min — AB (ref >90)
Glucose, Bld: 84 mg/dL (ref 70–99)
POTASSIUM: 3.4 meq/L — AB (ref 3.7–5.3)
SODIUM: 141 meq/L (ref 137–147)

## 2014-02-04 LAB — URINALYSIS, ROUTINE W REFLEX MICROSCOPIC
Bilirubin Urine: NEGATIVE
Glucose, UA: NEGATIVE mg/dL
Ketones, ur: 15 mg/dL — AB
LEUKOCYTES UA: NEGATIVE
Nitrite: NEGATIVE
Protein, ur: NEGATIVE mg/dL
Specific Gravity, Urine: 1.016 (ref 1.005–1.030)
UROBILINOGEN UA: 1 mg/dL (ref 0.0–1.0)
pH: 6.5 (ref 5.0–8.0)

## 2014-02-04 LAB — CBC WITH DIFFERENTIAL/PLATELET
Basophils Absolute: 0 10*3/uL (ref 0.0–0.1)
Basophils Relative: 0 % (ref 0–1)
EOS ABS: 0 10*3/uL (ref 0.0–0.7)
Eosinophils Relative: 1 % (ref 0–5)
HCT: 38.2 % (ref 36.0–46.0)
Hemoglobin: 12.7 g/dL (ref 12.0–15.0)
LYMPHS ABS: 0.7 10*3/uL (ref 0.7–4.0)
Lymphocytes Relative: 18 % (ref 12–46)
MCH: 28.3 pg (ref 26.0–34.0)
MCHC: 33.2 g/dL (ref 30.0–36.0)
MCV: 85.1 fL (ref 78.0–100.0)
Monocytes Absolute: 0.6 10*3/uL (ref 0.1–1.0)
Monocytes Relative: 14 % — ABNORMAL HIGH (ref 3–12)
NEUTROS PCT: 67 % (ref 43–77)
Neutro Abs: 2.6 10*3/uL (ref 1.7–7.7)
PLATELETS: 156 10*3/uL (ref 150–400)
RBC: 4.49 MIL/uL (ref 3.87–5.11)
RDW: 15 % (ref 11.5–15.5)
WBC: 3.9 10*3/uL — ABNORMAL LOW (ref 4.0–10.5)

## 2014-02-04 LAB — URINE MICROSCOPIC-ADD ON

## 2014-02-04 MED ORDER — POTASSIUM CHLORIDE CRYS ER 20 MEQ PO TBCR
20.0000 meq | EXTENDED_RELEASE_TABLET | Freq: Two times a day (BID) | ORAL | Status: DC
  Administered 2014-02-05 – 2014-02-08 (×8): 20 meq via ORAL
  Filled 2014-02-04 (×12): qty 1

## 2014-02-04 MED ORDER — ASPIRIN 300 MG RE SUPP
300.0000 mg | Freq: Every day | RECTAL | Status: DC
  Filled 2014-02-04 (×4): qty 1

## 2014-02-04 MED ORDER — ADULT MULTIVITAMIN W/MINERALS CH
1.0000 | ORAL_TABLET | Freq: Every day | ORAL | Status: DC
  Administered 2014-02-05 – 2014-02-08 (×4): 1 via ORAL
  Filled 2014-02-04 (×4): qty 1

## 2014-02-04 MED ORDER — LABETALOL HCL 5 MG/ML IV SOLN: 5.0000 mg | INTRAVENOUS | Status: DC | PRN

## 2014-02-04 MED ORDER — ENOXAPARIN SODIUM 40 MG/0.4ML ~~LOC~~ SOLN
40.0000 mg | SUBCUTANEOUS | Status: DC
  Administered 2014-02-05 – 2014-02-08 (×4): 40 mg via SUBCUTANEOUS
  Filled 2014-02-04 (×4): qty 0.4

## 2014-02-04 MED ORDER — ATORVASTATIN CALCIUM 10 MG PO TABS
10.0000 mg | ORAL_TABLET | Freq: Every day | ORAL | Status: DC
  Administered 2014-02-05 – 2014-02-08 (×4): 10 mg via ORAL
  Filled 2014-02-04 (×4): qty 1

## 2014-02-04 MED ORDER — SENNOSIDES-DOCUSATE SODIUM 8.6-50 MG PO TABS: 1.0000 | ORAL_TABLET | Freq: Every evening | ORAL | Status: DC | PRN

## 2014-02-04 MED ORDER — LEVOTHYROXINE SODIUM 25 MCG PO TABS
25.0000 ug | ORAL_TABLET | Freq: Every day | ORAL | Status: DC
  Administered 2014-02-05 – 2014-02-08 (×4): 25 ug via ORAL
  Filled 2014-02-04 (×5): qty 1

## 2014-02-04 MED ORDER — ASPIRIN 325 MG PO TABS
325.0000 mg | ORAL_TABLET | Freq: Every day | ORAL | Status: DC
  Administered 2014-02-05 – 2014-02-08 (×4): 325 mg via ORAL
  Filled 2014-02-04 (×4): qty 1

## 2014-02-04 MED ORDER — PANTOPRAZOLE SODIUM 40 MG PO TBEC
40.0000 mg | DELAYED_RELEASE_TABLET | Freq: Every day | ORAL | Status: DC
  Administered 2014-02-05 – 2014-02-08 (×4): 40 mg via ORAL
  Filled 2014-02-04 (×3): qty 1

## 2014-02-04 MED ORDER — POTASSIUM CHLORIDE CRYS ER 10 MEQ PO TBCR: 10.0000 meq | EXTENDED_RELEASE_TABLET | Freq: Two times a day (BID) | ORAL | Status: DC

## 2014-02-04 NOTE — Progress Notes (Signed)
Orthopedic Tech Progress Note Patient Details:  Kerry Wells 13-Nov-1919 [id number]  Ortho Devices Type of Ortho Device: Arm sling Ortho Device/Splint Location: RUE Ortho Device/Splint Interventions: Ordered;Application   Braulio Bosch 02/04/2014, 9:12 PM

## 2014-02-04 NOTE — H&P (Signed)
[facility]                                                                                    Patient Demographics  Kerry Wells, is a 80 y.o. female  CSN: [id number]  MRN: [id number]  DOB - 1920-04-25  Admit Date - 02/04/2014  Outpatient Primary MD for the patient is Nadeen Landau., MD   With History of -  Past Medical History  Diagnosis Date  . Breast cancer   . Hypertension   . Hyperlipidemia   . GERD (gastroesophageal reflux disease)   . Diverticulosis   . Cholelithiasis   . Gout   . Osteoporosis   . Anemia   . Depression   . Hemorrhoids   . Insomnia   . Hypothyroidism   . Hiatal hernia   . Breast cancer 2000  . Chronic constipation   . Unspecified urinary incontinence   . Edema   . Encounter for long-term (current) use of other medications   . Unspecified venous (peripheral) insufficiency   . Nonspecific abnormal results of liver function study   . Gout, unspecified   . Memory loss   . Disorders of bilirubin excretion   . Neutropenia, unspecified   . Anemia, unspecified   . Other specified cardiac dysrhythmias(427.89)   . Hypopotassemia   . Disorder of bone and cartilage, unspecified   . Postmastectomy lymphedema syndrome   . Diverticulitis of colon (without mention of hemorrhage)       Past Surgical History  Procedure Laterality Date  . Mastectomy  2008    left  . Vaginal hysterectomy    . Fibroid tumor removal    . Abdominal hysterectomy      in for   Chief Complaint  Patient presents with  . Fall     HPI  Kerry Wells  is a 80 y.o. female, with history of breast cancer, hypertension and hyperlipidemia who is presenting to our emergency room today after a fall and sustaining a right frontal laceration. CT of the head showed a probable left thalamic mass versus CVA and an MRI of the brain was ordered. I was asked to admit the patient. Patient has a history of dementia and is not a very good historian. She denies any  preceding headaches chest pains or palpitations. No reports of nausea vomiting or blurring of vision after the episode.    Review of Systems    In addition to the HPI above, No Fever-chills, No Headache, No changes with Vision or hearing, No problems swallowing food or Liquids, No Chest pain, Cough or Shortness of Breath, No Abdominal pain, No Nausea or Vommitting, Bowel movements are regular, No Blood in stool or Urine, No dysuria, No new skin rashes or bruises, No new joints pains-aches,  No new weakness, tingling, numbness in any extremity, No recent weight gain or loss, No polyuria, polydypsia or polyphagia, No significant Mental Stressors.  A full 10 point Review of Systems was done, except as stated above, all other Review of Systems were negative.   Social History History  Substance Use Topics  . Smoking status: Never Smoker   . Smokeless tobacco: Not on file  .  Alcohol Use: No     Family History Family History  Problem Relation Age of Onset  . Lung cancer Brother     2  . Colon cancer Sister     2 sisters dx at age 35 and 21  . Heart disease Mother   . Kidney disease Brother   . Heart disease Father   . Heart disease Sister   . Cirrhosis Brother      Prior to Admission medications   Medication Sig Start Date End Date Taking? Authorizing Provider  aspirin 81 MG tablet Take 81 mg by mouth daily.     Yes Historical Provider, MD  atorvastatin (LIPITOR) 10 MG tablet Take 10 mg by mouth daily.   Yes Historical Provider, MD  levothyroxine (SYNTHROID, LEVOTHROID) 25 MCG tablet Take 25 mcg by mouth daily before breakfast.   Yes Historical Provider, MD  Multiple Vitamin (MULTIVITAMIN) tablet Take 1 tablet by mouth daily.     Yes Historical Provider, MD  omeprazole (PRILOSEC) 20 MG capsule Take 20 mg by mouth daily.   Yes Historical Provider, MD  potassium chloride (KLOR-CON M10) 10 MEQ tablet Take 1 tablet (10 mEq total) by mouth 2 (two) times daily. 09/13/12  Yes  Harden Mo, MD  ketorolac (ACULAR) 0.4 % SOLN 1 drop 4 (four) times daily.      Historical Provider, MD  lisinopril (PRINIVIL,ZESTRIL) 40 MG tablet Take 40 mg by mouth daily.  12/27/12   Historical Provider, MD    No Known Allergies  Physical Exam  Vitals  Blood pressure 197/70, pulse 59, temperature 98 F (36.7 C), temperature source Oral, resp. rate 17, height 5\' 7"  (1.702 m), weight 54.25 kg (119 lb 9.6 oz), SpO2 99.00%.   1. General very pleasantly confused African American female in no acute distress , family around her  2. Normal affect and insight, Not Suicidal or Homicidal, confused.  3. No F.N deficits, ALL C.Nerves Intact, Strength 5/5 all 4 extremities, Sensation intact all 4 extremities, Plantars down going.  4. Ears and Eyes appear Normal, Conjunctivae clear, PERRLA. Moist Oral Mucosa. A right frontal laceration repaired noted  5. Supple Neck, No JVD, No cervical lymphadenopathy appriciated, No Carotid Bruits.  6. Symmetrical Chest wall movement, Good air movement bilaterally, CTAB.  7. irregular No Gallops, Rubs or date 2 systolic murmur, No Parasternal Heave.  8. Positive Bowel Sounds, Abdomen Soft, Non tender, No organomegaly appriciated,No rebound -guarding or rigidity.  9.  No Cyanosis, Normal Skin Turgor, No Skin Rash or Bruise.  10. Good muscle tone,  joints appear normal , no effusions, Normal ROM.  11. No Palpable Lymph Nodes in Neck or Axillae    Data Review  CBC  Recent Labs Lab 02/04/14 2101  WBC 3.9*  HGB 12.7  HCT 38.2  PLT 156  MCV 85.1  MCH 28.3  MCHC 33.2  RDW 15.0  LYMPHSABS 0.7  MONOABS 0.6  EOSABS 0.0  BASOSABS 0.0   ------------------------------------------------------------------------------------------------------------------  Chemistries   Recent Labs Lab 02/04/14 2101  NA 141  K 3.4*  CL 103  CO2 27  GLUCOSE 84  BUN 12  CREATININE 0.95  CALCIUM 8.8    ------------------------------------------------------------------------------------------------------------------ estimated creatinine clearance is 33.1 ml/min (by C-G formula based on Cr of 0.95). ------------------------------------------------------------------------------------------------------------------ No results found for this basename: TSH, T4TOTAL, FREET3, T3FREE, THYROIDAB,  in the last 72 hours   Coagulation profile No results found for this basename: INR, PROTIME,  in the last 168 hours ------------------------------------------------------------------------------------------------------------------- No results found for  this basename: DDIMER,  in the last 72 hours -------------------------------------------------------------------------------------------------------------------  Cardiac Enzymes No results found for this basename: CK, CKMB, TROPONINI, MYOGLOBIN,  in the last 168 hours ------------------------------------------------------------------------------------------------------------------ No components found with this basename: POCBNP,    ---------------------------------------------------------------------------------------------------------------  Urinalysis    Component Value Date/Time   COLORURINE YELLOW 02/04/2014 2106   APPEARANCEUR CLOUDY* 02/04/2014 2106   LABSPEC 1.016 02/04/2014 2106   PHURINE 6.5 02/04/2014 2106   GLUCOSEU NEGATIVE 02/04/2014 2106   HGBUR TRACE* 02/04/2014 2106   BILIRUBINUR NEGATIVE 02/04/2014 2106   KETONESUR 15* 02/04/2014 2106   PROTEINUR NEGATIVE 02/04/2014 2106   UROBILINOGEN 1.0 02/04/2014 2106   NITRITE NEGATIVE 02/04/2014 2106   LEUKOCYTESUR NEGATIVE 02/04/2014 2106    ----------------------------------------------------------------------------------------------------------------     Imaging results:   Dg Shoulder Right  02/04/2014   CLINICAL DATA:  Anterior right shoulder pain following a fall today.  EXAM: RIGHT SHOULDER - 2+  VIEW  COMPARISON:  None.  FINDINGS: Diffuse osteopenia. Old, probable old, healed right humeral shaft fracture. No acute fracture or dislocation seen. Small amount of linear atelectasis or scarring in the right mid to lower lung zone. Thoracic spine degenerative changes and calcified mediastinal lymph nodes.  IMPRESSION: No fracture or dislocation.   Electronically Signed   By: Enrique Sack M.D.   On: 02/04/2014 18:21   Ct Head Wo Contrast  02/04/2014   CLINICAL DATA:  Injury.  EXAM: CT HEAD WITHOUT CONTRAST  TECHNIQUE: Contiguous axial images were obtained from the base of the skull through the vertex without intravenous contrast.  COMPARISON:  Head CT 03/15/2009.  FINDINGS: A 2.1 x 1.7 cm left alignment mass is present in with surrounding edema and compression of the third ventricle. Minimal midline shift from left to right is present. No hemorrhage. No hydrocephalus. Scalp laceration and hematoma noted right frontal region. No underlying bony abnormality.  IMPRESSION: 1. 2.1 x 1.7 cm left splenic mass with surrounding edema and mild compression of the third ventricle. Mild midline shift from left to right. This could represent a malignancy. MRI the brain with scaling enhancement should be considered. 2. Right frontal scalp laceration and hematoma. No underlying fracture. These results were called by telephone at the time of interpretation on 02/04/2014 at 7:28 PM to Dr. Blanchie Dessert , who verbally acknowledged these results.   Electronically Signed   By: Marcello Moores  Register   On: 02/04/2014 19:29    My personal review of EKG: Rhythm NSR, with PAC    Assessment & Plan  1. left thalamic mass versus CVA by CT scan, MRI pending 2. Status post fall with right frontal laceration and right shoulder injury status post negative right shoulder x-ray 3. Hypertension 4. Dementia  Plan Admit to telemetry Check MRI of the brain Aspirin/Lipitor Neurology on consult   DVT Prophylaxis Lovenox  AM Labs  Ordered, also please review Full Orders  Family Communication: Admission, patients condition and plan of care including tests being ordered have been discussed with the patient and daughter who indicate understanding and agree with the plan and Code Status.  Code Status full  Disposition Plan: Home  Time spent in minutes : 34 minutes  Condition GUARDED   @SIGNATURE @

## 2014-02-04 NOTE — ED Notes (Signed)
Called ortho and returned call.  Ortho en route to ED.

## 2014-02-04 NOTE — ED Notes (Signed)
Report given to [place], [place].  Patient to be moved to Pod C for admission.

## 2014-02-04 NOTE — Consult Note (Signed)
Neurology Consultation Reason for Consult: Abnormal CT Referring Physician: Alpha Gula, A  CC: Fall  History is obtained from: Patient, daughter  HPI: Kerry Wells is a 80 y.o. female with a history of mild dementia as well as several days of increased unsteadiness. She had a fall this evening and therefore was brought to the emergency room. In the emergency room, she had a CT of her head which shows a thalamic hypodensity in the left. There is question of mass versus infarct.   LKW: Unclear at least several days ago tpa given?: no, outside of window    ROS: A 14 point ROS was performed and is negative except as noted in the HPI.  Past Medical History  Diagnosis Date  . Breast cancer   . Hypertension   . Hyperlipidemia   . GERD (gastroesophageal reflux disease)   . Diverticulosis   . Cholelithiasis   . Gout   . Osteoporosis   . Anemia   . Depression   . Hemorrhoids   . Insomnia   . Hypothyroidism   . Hiatal hernia   . Breast cancer 2000  . Chronic constipation   . Unspecified urinary incontinence   . Edema   . Encounter for long-term (current) use of other medications   . Unspecified venous (peripheral) insufficiency   . Nonspecific abnormal results of liver function study   . Gout, unspecified   . Memory loss   . Disorders of bilirubin excretion   . Neutropenia, unspecified   . Anemia, unspecified   . Other specified cardiac dysrhythmias(427.89)   . Hypopotassemia   . Disorder of bone and cartilage, unspecified   . Postmastectomy lymphedema syndrome   . Diverticulitis of colon (without mention of hemorrhage)     Family History: No history of stroke  Social History: Tob: Denies  Exam: Current vital signs: BP 155/60  Pulse 60  Temp(Src) 98.2 F (36.8 C) (Oral)  Resp 20  Ht 5\' 7"  (1.702 m)  Wt 54.25 kg (119 lb 9.6 oz)  BMI 18.73 kg/m2  SpO2 99% Vital signs in last 24 hours: Temp:  [97.7 F (36.5 C)-98.3 F (36.8 C)] 98.2 F (36.8 C) (05/02  2315) Pulse Rate:  [53-88] 60 (05/02 2315) Resp:  [15-20] 20 (05/02 2315) BP: (151-222)/(60-79) 155/60 mmHg (05/02 2315) SpO2:  [98 %-100 %] 99 % (05/02 2315) Weight:  [54.25 kg (119 lb 9.6 oz)] 54.25 kg (119 lb 9.6 oz) (05/02 1730)  General: In bed, NAD CV: Regular rate and rhythm Mental Status: Patient is awake, alert, oriented to person, place, unable to give month or year Immediate and remote memory are intact. Patient is very tangential answers, often answering questions that I did not ask. No signs of aphasia or neglect Cranial Nerves: II: Visual Fields are full. Pupils are equal, round, and reactive to light.  Discs are difficult to visualize. III,IV, VI: EOMI without ptosis or diploplia.  V: Facial sensation is symmetric to temperature VII: Facial movement is symmetric.  VIII: hearing is intact to voice X: Uvula elevates symmetrically XI: Shoulder shrug is symmetric. XII: tongue is midline without atrophy or fasciculations.  Motor: Tone is normal. Bulk is normal. 5/5 strength was present in all four extremities.  Sensory: Sensation is symmetric to light touch and temperature in the arms and legs. Deep Tendon Reflexes: 2+ and symmetric in the biceps and patellae.  Cerebellar: FNF  intact bilaterally Gait: Not tested secondary to multiple medical monitors in ED setting.  I have reviewed labs in epic and the results pertinent to this consultation are: Unremarkable CBC, BMP  I have reviewed the images obtained: CT head-large hypodensity in the left thalamus  Impression: 80 year old female with hypodensity in the left thalamus. Differential includes mass versus infarct. I would expect if this did are present infarct the patient have much more severe symptoms than what she has. Therefore I expect that this will be a mass of some type. An MRI, however, would better help elucidate what it is. If it does turn out to be a mass, and at her age serious consideration  prior to any intervention would have to be undertaken.  Recommendations: 1) MRI brain with/without contrast. 2) further recommendations following MRI. 3) would give ASA given possibility of stroke.   Roland Rack, MD [facility] [phone]  If 7pm- 7am, please page neurology on call as listed in [place].

## 2014-02-04 NOTE — ED Provider Notes (Signed)
CSN: [id number]     Arrival date & time 02/04/14  1724 History   First MD Initiated Contact with Patient 02/04/14 2005     Chief Complaint  Patient presents with  . Fall   HPI Patient presents to the emergency room for evaluation after a fall today. The family states that she does have some episodes of falling and unsteadiness. This is not new. The patient is supposed to use a walker but does not like to do so. The patient was standing in the kitchen today, she turned around, and lost her balance.  He fell striking the right side of her head. Family states she did not lose consciousness. She has been acting normally. They brought her in for evaluation. Patient denies any other complaints of pain. Past Medical History  Diagnosis Date  . Breast cancer   . Hypertension   . Hyperlipidemia   . GERD (gastroesophageal reflux disease)   . Diverticulosis   . Cholelithiasis   . Gout   . Osteoporosis   . Anemia   . Depression   . Hemorrhoids   . Insomnia   . Hypothyroidism   . Hiatal hernia   . Breast cancer 2000  . Chronic constipation   . Unspecified urinary incontinence   . Edema   . Encounter for long-term (current) use of other medications   . Unspecified venous (peripheral) insufficiency   . Nonspecific abnormal results of liver function study   . Gout, unspecified   . Memory loss   . Disorders of bilirubin excretion   . Neutropenia, unspecified   . Anemia, unspecified   . Other specified cardiac dysrhythmias(427.89)   . Hypopotassemia   . Disorder of bone and cartilage, unspecified   . Postmastectomy lymphedema syndrome   . Diverticulitis of colon (without mention of hemorrhage)    Past Surgical History  Procedure Laterality Date  . Mastectomy  2008    left  . Vaginal hysterectomy    . Fibroid tumor removal    . Abdominal hysterectomy     Family History  Problem Relation Age of Onset  . Lung cancer Brother     2  . Colon cancer Sister     2 sisters dx at age 28 and  45  . Heart disease Mother   . Kidney disease Brother   . Heart disease Father   . Heart disease Sister   . Cirrhosis Brother    History  Substance Use Topics  . Smoking status: Never Smoker   . Smokeless tobacco: Not on file  . Alcohol Use: No   OB History   Grav Para Term Preterm Abortions TAB SAB Ect Mult Living                 Review of Systems  All other systems reviewed and are negative.     Allergies  Review of patient's allergies indicates no known allergies.  Home Medications   Prior to Admission medications   Medication Sig Start Date End Date Taking? Authorizing Provider  aspirin 81 MG tablet Take 81 mg by mouth daily.     Yes Historical Provider, MD  atorvastatin (LIPITOR) 10 MG tablet Take 10 mg by mouth daily.   Yes Historical Provider, MD  levothyroxine (SYNTHROID, LEVOTHROID) 25 MCG tablet Take 25 mcg by mouth daily before breakfast.   Yes Historical Provider, MD  Multiple Vitamin (MULTIVITAMIN) tablet Take 1 tablet by mouth daily.     Yes Historical Provider, MD  omeprazole (PRILOSEC) 20  MG capsule Take 20 mg by mouth daily.   Yes Historical Provider, MD  potassium chloride (KLOR-CON M10) 10 MEQ tablet Take 1 tablet (10 mEq total) by mouth 2 (two) times daily. 09/13/12  Yes Harden Mo, MD  ketorolac (ACULAR) 0.4 % SOLN 1 drop 4 (four) times daily.      Historical Provider, MD  lisinopril (PRINIVIL,ZESTRIL) 40 MG tablet Take 40 mg by mouth daily.  12/27/12   Historical Provider, MD   BP 197/70  Pulse 59  Temp(Src) 98 F (36.7 C) (Oral)  Resp 17  Ht 5\' 7"  (1.702 m)  Wt 119 lb 9.6 oz (54.25 kg)  BMI 18.73 kg/m2  SpO2 99% Physical Exam  Nursing note and vitals reviewed. Constitutional: No distress.  Frail, elderly  HENT:  Head: Normocephalic and atraumatic.  Right Ear: External ear normal.  Left Ear: External ear normal.  Cephalhematoma right forehead, very small less than 2 mm abrasion/laceration  Eyes: Conjunctivae are normal. Right eye  exhibits no discharge. Left eye exhibits no discharge. No scleral icterus.  Neck: Neck supple. No tracheal deviation present.  Cardiovascular: Normal rate, regular rhythm and intact distal pulses.   Pulmonary/Chest: Effort normal and breath sounds normal. No stridor. No respiratory distress. She has no wheezes. She has no rales.  Abdominal: Soft. Bowel sounds are normal. She exhibits no distension. There is no tenderness. There is no rebound and no guarding.  Musculoskeletal: She exhibits no edema and no tenderness.  Neurological: She is alert. She has normal strength. No cranial nerve deficit (no facial droop, extraocular movements intact, no slurred speech) or sensory deficit. She exhibits normal muscle tone. She displays no seizure activity. Coordination normal.  Skin: Skin is warm and dry. No rash noted.  Psychiatric: She has a normal mood and affect.    ED Course  Procedures (including critical care time) Labs Review Labs Reviewed  CBC WITH DIFFERENTIAL - Abnormal; Notable for the following:    WBC 3.9 (*)    Monocytes Relative 14 (*)    All other components within normal limits  BASIC METABOLIC PANEL - Abnormal; Notable for the following:    Potassium 3.4 (*)    GFR calc non Af Amer 51 (*)    GFR calc Af Amer 59 (*)    All other components within normal limits  URINALYSIS, ROUTINE W REFLEX MICROSCOPIC - Abnormal; Notable for the following:    APPearance CLOUDY (*)    Hgb urine dipstick TRACE (*)    Ketones, ur 15 (*)    All other components within normal limits  URINE MICROSCOPIC-ADD ON - Abnormal; Notable for the following:    Squamous Epithelial / LPF FEW (*)    Bacteria, UA FEW (*)    All other components within normal limits    Imaging Review Dg Shoulder Right  02/04/2014   CLINICAL DATA:  Anterior right shoulder pain following a fall today.  EXAM: RIGHT SHOULDER - 2+ VIEW  COMPARISON:  None.  FINDINGS: Diffuse osteopenia. Old, probable old, healed right humeral shaft  fracture. No acute fracture or dislocation seen. Small amount of linear atelectasis or scarring in the right mid to lower lung zone. Thoracic spine degenerative changes and calcified mediastinal lymph nodes.  IMPRESSION: No fracture or dislocation.   Electronically Signed   By: Enrique Sack M.D.   On: 02/04/2014 18:21   Ct Head Wo Contrast  02/04/2014   CLINICAL DATA:  Injury.  EXAM: CT HEAD WITHOUT CONTRAST  TECHNIQUE: Contiguous axial images  were obtained from the base of the skull through the vertex without intravenous contrast.  COMPARISON:  Head CT 03/15/2009.  FINDINGS: A 2.1 x 1.7 cm left alignment mass is present in with surrounding edema and compression of the third ventricle. Minimal midline shift from left to right is present. No hemorrhage. No hydrocephalus. Scalp laceration and hematoma noted right frontal region. No underlying bony abnormality.  IMPRESSION: 1. 2.1 x 1.7 cm left splenic mass with surrounding edema and mild compression of the third ventricle. Mild midline shift from left to right. This could represent a malignancy. MRI the brain with scaling enhancement should be considered. 2. Right frontal scalp laceration and hematoma. No underlying fracture. These results were called by telephone at the time of interpretation on 02/04/2014 at 7:28 PM to Dr. Blanchie Dessert , who verbally acknowledged these results.   Electronically Signed   By: Marcello Moores  Register   On: 02/04/2014 19:29     EKG Interpretation   Date/Time:  Saturday Feb 04 2014 20:37:58 EDT Ventricular Rate:  58 PR Interval:  174 QRS Duration: 86 QT Interval:  464 QTC Calculation: 456 R Axis:   48 Text Interpretation:  Sinus rhythm Atrial premature complex Probable left  atrial enlargement Left ventricular hypertrophy No significant change  since last tracing Confirmed by Yaeli Hartung  MD-J, Jerome Viglione ([id number]) on 02/04/2014  9:07:31 PM      MDM   Final diagnoses:  Fall  Abnormal MRI of head    Patient does not have evidence of  significant injury from her fall. CT scan however does show an abnormality that could be related to malignancy.  Patient's neurologic exam is unremarkable at this time although I did not test her gait.  We'll plan on admission to the hospital for further evaluation. I have ordered a brain MRI.    Kathalene Frames, MD 02/05/14 1537

## 2014-02-04 NOTE — ED Notes (Addendum)
She states she was standing in the kitchen, turned around, lost her balanace and fell and hit her head. She has a hematoma to R forehead with a laceration, dried blood around her nose, and c/o R shoulder and head pain. She is alert and acting like her normal self per family. She denies LOC, she denies neck pain.

## 2014-02-05 ENCOUNTER — Encounter (HOSPITAL_COMMUNITY): Payer: Self-pay | Admitting: *Deleted

## 2014-02-05 ENCOUNTER — Inpatient Hospital Stay (HOSPITAL_COMMUNITY): Payer: Medicare Other

## 2014-02-05 DIAGNOSIS — R269 Unspecified abnormalities of gait and mobility: Secondary | ICD-10-CM

## 2014-02-05 LAB — LIPID PANEL
CHOLESTEROL: 168 mg/dL (ref 0–200)
HDL: 64 mg/dL (ref >39)
LDL Cholesterol: 95 mg/dL (ref 0–99)
Total CHOL/HDL Ratio: 2.6 RATIO
Triglycerides: 46 mg/dL (ref <150)
VLDL: 9 mg/dL (ref 0–40)

## 2014-02-05 NOTE — Progress Notes (Signed)
VASCULAR LAB PRELIMINARY  PRELIMINARY  PRELIMINARY  PRELIMINARY  Carotid Dopplers completed.    Preliminary report:  1-39% ICA stenosis.  Vertebral artery flow is antegrade.  Iantha Fallen, RVT 02/05/2014, 10:23 AM

## 2014-02-05 NOTE — Progress Notes (Signed)
Subjective: Patient had no new complaints, including no headache. She's not had a recurrent fall. She was given a walker by physical therapy. Family members report that she has refused to use a walker prior to now.  Objective: Current vital signs: BP 147/55  Pulse 60  Temp(Src) 97.9 F (36.6 C) (Oral)  Resp 20  Ht 5\' 5"  (1.651 m)  Wt 56.8 kg (125 lb 3.5 oz)  BMI 20.84 kg/m2  SpO2 99%  Neurologic Exam: Alert and in no acute distress. Mental status was normal except for possibly mild short-term memory difficulty. Speech was normal. No facial weakness noted. Patient moved extremities equally.  Medications: I have reviewed the patient's current medications.  Assessment/Plan: Chronic unstable gait with recurrent falls as well as an abnormality involving left thalamic region indicative of mass lesion with surrounding edema. MRI studies pending.  Recommend continuing physical therapy intervention for patient's unstable gait. Further management to be formulated when MRI study has been completed.  C.R. Nicole Kindred, MD [facility] [phone]  02/05/2014  9:49 AM

## 2014-02-05 NOTE — Evaluation (Addendum)
Physical Therapy Evaluation Patient Details Name: Kerry Wells MRN: [id number] DOB: [birth year] Today's Date: 02/05/2014   History of Present Illness  Kerry Wells is a 80 y.o. female with a history of mild dementia as well as several days of increased unsteadiness. She had a fall this evening and therefore was brought to the emergency room. In the emergency room, she had a CT of her head which shows a thalamic hypodensity in the left.   Clinical Impression  Pt adm due to the above. Pt presents with balance deficits and is a fall risk. Pt to benefit from skilled acute PT to maximize mobility prior to D/C home with family. Pt to benefit from ambulating with RW upon D/C , daughter and pt agreeable. Will recommend HHPT for safety evaluation and to maximize mobility in home setting upon D/C.    Follow Up Recommendations Home health PT;Supervision/Assistance - 24 hour    Equipment Recommendations  None recommended by PT    Recommendations for Other Services       Precautions / Restrictions Precautions Precautions: Fall Precaution Comments: multiple falls  Required Braces or Orthoses: Sling (for comfort for Rt UE sprain ) Restrictions Weight Bearing Restrictions: No      Mobility  Bed Mobility Overal bed mobility: Modified Independent             General bed mobility comments: HOB elevated and use of handrails   Transfers Overall transfer level: Needs assistance Equipment used: Rolling walker (2 wheeled) Transfers: Sit to/from Stand Sit to Stand: Supervision         General transfer comment: supervision for cues for hand placement and sequencing with RW   Ambulation/Gait Ambulation/Gait assistance: Min guard;Supervision Ambulation Distance (Feet): 90 Feet Assistive device: Rolling walker (2 wheeled) Gait Pattern/deviations: Step-through pattern;Decreased stride length;Narrow base of support Gait velocity: decreased Gait velocity interpretation: Below normal  speed for age/gender General Gait Details: min guard primarily with direction changes and to manage walker   Stairs            Wheelchair Mobility    Modified Rankin (Stroke Patients Only)       Balance Overall balance assessment: Needs assistance;History of Falls Sitting-balance support: Feet supported;No upper extremity supported Sitting balance-Leahy Scale: Good     Standing balance support: During functional activity;Bilateral upper extremity supported;No upper extremity supported Standing balance-Leahy Scale: Fair Standing balance comment: pt able to stand without UE support; minimally able to accept weightshifting; more stable in standing with RW                              Pertinent Vitals/Pain No c/o pain. Pt has sling for comfort for Rt UE .     Home Living Family/patient expects to be discharged to:: Private residence Living Arrangements: Children;Other relatives Available Help at Discharge: Family;Available 24 hours/day Type of Home: House Home Access: Stairs to enter Entrance Stairs-Rails: Can reach both;Right;Left Entrance Stairs-Number of Steps: 3 Home Layout: One level Home Equipment: Walker - 2 wheels;Cane - single point      Prior Function Level of Independence: Needs assistance   Gait / Transfers Assistance Needed: independent with gt   ADL's / Homemaking Assistance Needed: has (A) if needed but was independent per daughte         Hand Dominance        Extremity/Trunk Assessment   Upper Extremity Assessment: Defer to OT evaluation  Lower Extremity Assessment: Overall WFL for tasks assessed      Cervical / Trunk Assessment: Normal  Communication   Communication: No difficulties  Cognition Arousal/Alertness: Awake/alert Behavior During Therapy: WFL for tasks assessed/performed Overall Cognitive Status: History of cognitive impairments - at baseline Area of Impairment: Orientation Orientation Level:  Time;Place   Memory: Decreased short-term memory         General Comments: hx of dementia     General Comments      Exercises        Assessment/Plan    PT Assessment Patient needs continued PT services  PT Diagnosis Abnormality of gait   PT Problem List Decreased activity tolerance;Decreased balance;Decreased mobility;Decreased cognition;Decreased safety awareness  PT Treatment Interventions DME instruction;Gait training;Stair training;Therapeutic activities;Functional mobility training;Therapeutic exercise;Balance training;Neuromuscular re-education;Patient/family education   PT Goals (Current goals can be found in the Care Plan section) Acute Rehab PT Goals Patient Stated Goal: to go home  PT Goal Formulation: With patient Time For Goal Achievement: 02/12/14 Potential to Achieve Goals: Good    Frequency Min 3X/week   Barriers to discharge        Co-evaluation               End of Session Equipment Utilized During Treatment: Gait belt Activity Tolerance: Patient tolerated treatment well Patient left: in chair;with call bell/phone within reach;with chair alarm set Nurse Communication: Mobility status         Time: 7654-6503 PT Time Calculation (min): 17 min   Charges:   PT Evaluation $Initial PT Evaluation Tier I: 1 Procedure PT Treatments $Gait Training: 8-22 mins   PT G CodesGustavus Bryant , [place]  [phone]  02/05/2014, 8:37 AM

## 2014-02-05 NOTE — Progress Notes (Signed)
[facility] PROGRESS NOTE  Kerry Wells JKD:[id number] DOB: 10-03-1920 DOA: 02/04/2014 PCP: Nadeen Landau., MD  Assessment/Plan: 1. left thalamic mass with surrounding edema versus CVA by CT scan -Await MRI, 2-D echo and carotid Doppler -Continue aspirin Lipitor -Appreciate neuro assistance, follow. 2. Status post fall with right frontal laceration and right shoulder injury status post negative right shoulder x-ray  -Continue supportive care 3. Hypertension  -Holding lisinopril for now to allow for permissive hypertension, labetalol IV when necessary follow 4. Dementia   Code Status: Full Family Communication: Daughter at bedside Disposition Plan: Pending clinical course   Consultants:  Neurology  Procedures:  Echocardiogram-pending  Antibiotics:  none  HPI/Subjective: Patient alert, disoriented some intermittent confusion. Denies any new complaints.   Objective: Filed Vitals:   02/05/14 1403  BP: 135/48  Pulse: 53  Temp: 98.7 F (37.1 C)  Resp: 20    Intake/Output Summary (Last 24 hours) at 02/05/14 1505 Last data filed at 02/05/14 1200  Gross per 24 hour  Intake    480 ml  Output      0 ml  Net    480 ml   Filed Weights   02/04/14 1730 02/05/14 0000  Weight: 54.25 kg (119 lb 9.6 oz) 56.8 kg (125 lb 3.5 oz)    Exam:  General: alert & disoriented, answers some simple questions appropriately In NAD   Cardiovascular: RRR, nl S1 s2 Respiratory: CTAB Abdomen: soft +BS NT/ND, no masses palpable Extremities: No cyanosis and no edema    Data Reviewed: Basic Metabolic Panel:  Recent Labs Lab 02/04/14 2101  NA 141  K 3.4*  CL 103  CO2 27  GLUCOSE 84  BUN 12  CREATININE 0.95  CALCIUM 8.8   Liver Function Tests: No results found for this basename: AST, ALT, ALKPHOS, BILITOT, PROT, ALBUMIN,  in the last 168 hours No results found for this basename: LIPASE, AMYLASE,  in the last 168 hours No results found for this basename:  AMMONIA,  in the last 168 hours CBC:  Recent Labs Lab 02/04/14 2101  WBC 3.9*  NEUTROABS 2.6  HGB 12.7  HCT 38.2  MCV 85.1  PLT 156   Cardiac Enzymes: No results found for this basename: CKTOTAL, CKMB, CKMBINDEX, TROPONINI,  in the last 168 hours BNP (last 3 results) No results found for this basename: PROBNP,  in the last 8760 hours CBG: No results found for this basename: GLUCAP,  in the last 168 hours  No results found for this or any previous visit (from the past 240 hour(s)).   Studies: Dg Chest 2 View  02/05/2014   CLINICAL DATA:  Stroke.  EXAM: CHEST  2 VIEW  COMPARISON:  DG CHEST 2 VIEW dated 09/13/2012  FINDINGS: Mediastinum and hilar structures are normal. Subsegmental atelectasis in the lung bases. Mild developing infiltrate left lung base cannot be excluded. No pleural effusion or pneumothorax. Stable cardiomegaly. Normal pulmonary vascularity. No pneumothorax. Surgical clips left chest.  IMPRESSION: 1. Mild bibasilar atelectasis with possible developing small infiltrate left lung base . 2. Stable cardiomegaly.   Electronically Signed   By: Marcello Moores  Register   On: 02/05/2014 07:43   Dg Shoulder Right  02/04/2014   CLINICAL DATA:  Anterior right shoulder pain following a fall today.  EXAM: RIGHT SHOULDER - 2+ VIEW  COMPARISON:  None.  FINDINGS: Diffuse osteopenia. Old, probable old, healed right humeral shaft fracture. No acute fracture or dislocation seen. Small amount of linear atelectasis or scarring in the right mid to  lower lung zone. Thoracic spine degenerative changes and calcified mediastinal lymph nodes.  IMPRESSION: No fracture or dislocation.   Electronically Signed   By: Enrique Sack M.D.   On: 02/04/2014 18:21   Ct Head Wo Contrast  02/04/2014   CLINICAL DATA:  Injury.  EXAM: CT HEAD WITHOUT CONTRAST  TECHNIQUE: Contiguous axial images were obtained from the base of the skull through the vertex without intravenous contrast.  COMPARISON:  Head CT 03/15/2009.  FINDINGS:  A 2.1 x 1.7 cm left alignment mass is present in with surrounding edema and compression of the third ventricle. Minimal midline shift from left to right is present. No hemorrhage. No hydrocephalus. Scalp laceration and hematoma noted right frontal region. No underlying bony abnormality.  IMPRESSION: 1. 2.1 x 1.7 cm left splenic mass with surrounding edema and mild compression of the third ventricle. Mild midline shift from left to right. This could represent a malignancy. MRI the brain with scaling enhancement should be considered. 2. Right frontal scalp laceration and hematoma. No underlying fracture. These results were called by telephone at the time of interpretation on 02/04/2014 at 7:28 PM to Dr. Blanchie Dessert , who verbally acknowledged these results.   Electronically Signed   By: Marcello Moores  Register   On: 02/04/2014 19:29    Scheduled Meds: . aspirin  300 mg Rectal Daily   Or  . aspirin  325 mg Oral Daily  . atorvastatin  10 mg Oral Daily  . enoxaparin (LOVENOX) injection  40 mg Subcutaneous Q24H  . levothyroxine  25 mcg Oral QAC breakfast  . multivitamin with minerals  1 tablet Oral Daily  . pantoprazole  40 mg Oral Daily  . potassium chloride  20 mEq Oral BID   Continuous Infusions:   Principal Problem:   Brain mass Active Problems:   Fall   CVA (cerebral vascular accident)    Time spent: [facility] Pager [phone]. If 7PM-7AM, please contact night-coverage at [email or web address], password [facility] 02/05/2014, 3:05 PM  LOS: 1 day

## 2014-02-06 ENCOUNTER — Inpatient Hospital Stay (HOSPITAL_COMMUNITY): Payer: Medicare Other

## 2014-02-06 DIAGNOSIS — F0390 Unspecified dementia without behavioral disturbance: Secondary | ICD-10-CM

## 2014-02-06 DIAGNOSIS — E039 Hypothyroidism, unspecified: Secondary | ICD-10-CM

## 2014-02-06 DIAGNOSIS — I369 Nonrheumatic tricuspid valve disorder, unspecified: Secondary | ICD-10-CM

## 2014-02-06 LAB — HEMOGLOBIN A1C
Hgb A1c MFr Bld: 5.7 % — ABNORMAL HIGH (ref <5.7)
MEAN PLASMA GLUCOSE: 117 mg/dL — AB (ref <117)

## 2014-02-06 MED ORDER — GADOBENATE DIMEGLUMINE 529 MG/ML IV SOLN
13.0000 mL | Freq: Once | INTRAVENOUS | Status: AC | PRN
  Administered 2014-02-06: 11 mL via INTRAVENOUS

## 2014-02-06 NOTE — Progress Notes (Signed)
NEURO HOSPITALIST PROGRESS NOTE   SUBJECTIVE:                                                                                                                        Patient has no complaints.  She has walked with PT yesterday and daughter would like to walker her in halls today. No HA.  Remains confused at date and place but this is baseline per daughter.   OBJECTIVE:                                                                                                                           Vital signs in last 24 hours: Temp:  [97.8 F (36.6 C)-98.7 F (37.1 C)] 98.1 F (36.7 C) (05/04 0859) Pulse Rate:  [53-60] 55 (05/04 0859) Resp:  [18-20] 18 (05/04 0859) BP: (135-186)/(46-81) 155/52 mmHg (05/04 0859) SpO2:  [98 %-100 %] 100 % (05/04 0859)  Intake/Output from previous day: 05/03 0701 - 05/04 0700 In: 720 [P.O.:720] Out: -  Intake/Output this shift:   Nutritional status: Cardiac  Past Medical History  Diagnosis Date  . Breast cancer   . Hypertension   . Hyperlipidemia   . GERD (gastroesophageal reflux disease)   . Diverticulosis   . Cholelithiasis   . Gout   . Osteoporosis   . Anemia   . Depression   . Hemorrhoids   . Insomnia   . Hypothyroidism   . Hiatal hernia   . Breast cancer 2000  . Chronic constipation   . Unspecified urinary incontinence   . Edema   . Encounter for long-term (current) use of other medications   . Unspecified venous (peripheral) insufficiency   . Nonspecific abnormal results of liver function study   . Gout, unspecified   . Memory loss   . Disorders of bilirubin excretion   . Neutropenia, unspecified   . Anemia, unspecified   . Other specified cardiac dysrhythmias(427.89)   . Hypopotassemia   . Disorder of bone and cartilage, unspecified   . Postmastectomy lymphedema syndrome   . Diverticulitis of colon (without mention of hemorrhage)      Neurologic Exam:   Mental Status: Alert, not  oriented to date, year and month, intent on watching T.V.  Speech fluent without evidence of aphasia.  Able to follow 3 step commands without difficulty. Cranial Nerves: II: Visual fields grossly normal, pupils equal, round, reactive to light and accommodation III,IV, VI: ptosis not present, extra-ocular motions intact bilaterally V,VII: smile symmetric, facial light touch sensation normal bilaterally VIII: hearing normal bilaterally IX,X: gag reflex present XI: bilateral shoulder shrug XII: midline tongue extension without atrophy or fasciculations  Motor: Right : Upper extremity   5/5    Left:     Upper extremity   5/5  Lower extremity   5/5     Lower extremity   5/5 Tone and bulk:normal tone throughout; no atrophy noted Sensory: Pinprick and light touch intact throughout, bilaterally Deep Tendon Reflexes:  Right: Upper Extremity   Left: Upper extremity   biceps (C-5 to C-6) 2/4   biceps (C-5 to C-6) 2/4 tricep (C7) 2/4    triceps (C7) 2/4 Brachioradialis (C6) 2/4  Brachioradialis (C6) 2/4  Lower Extremity Lower Extremity  quadriceps (L-2 to L-4) 1/4   quadriceps (L-2 to L-4) 1/4 Achilles (S1) 1/4   Achilles (S1) 1/4  Plantars: Right: downgoing   Left: downgoing   Lab Results: Basic Metabolic Panel:  Recent Labs Lab 02/04/14 2101  NA 141  K 3.4*  CL 103  CO2 27  GLUCOSE 84  BUN 12  CREATININE 0.95  CALCIUM 8.8    Liver Function Tests: No results found for this basename: AST, ALT, ALKPHOS, BILITOT, PROT, ALBUMIN,  in the last 168 hours No results found for this basename: LIPASE, AMYLASE,  in the last 168 hours No results found for this basename: AMMONIA,  in the last 168 hours  CBC:  Recent Labs Lab 02/04/14 2101  WBC 3.9*  NEUTROABS 2.6  HGB 12.7  HCT 38.2  MCV 85.1  PLT 156    Cardiac Enzymes: No results found for this basename: CKTOTAL, CKMB, CKMBINDEX, TROPONINI,  in the last 168 hours  Lipid Panel:  Recent Labs Lab 02/05/14 0739  CHOL 168   TRIG 46  HDL 64  CHOLHDL 2.6  VLDL 9  LDLCALC 95    CBG: No results found for this basename: GLUCAP,  in the last 168 hours  Microbiology: Results for orders placed during the hospital encounter of 07/30/08  URINE CULTURE     Status: None   Collection Time    07/30/08  7:48 AM      Result Value Ref Range Status   Specimen Description URINE, RANDOM   Final   Special Requests NONE   Final   Colony Count >=100,000 COLONIES/ML   Final   Culture     Final   Value: Multiple bacterial morphotypes present, none predominant. Suggest appropriate recollection if clinically indicated.   Report Status 08/01/2008 FINAL   Final    Coagulation Studies: No results found for this basename: LABPROT, INR,  in the last 72 hours  Imaging: Dg Chest 2 View  02/05/2014   CLINICAL DATA:  Stroke.  EXAM: CHEST  2 VIEW  COMPARISON:  DG CHEST 2 VIEW dated 09/13/2012  FINDINGS: Mediastinum and hilar structures are normal. Subsegmental atelectasis in the lung bases. Mild developing infiltrate left lung base cannot be excluded. No pleural effusion or pneumothorax. Stable cardiomegaly. Normal pulmonary vascularity. No pneumothorax. Surgical clips left chest.  IMPRESSION: 1. Mild bibasilar atelectasis with possible developing small infiltrate left lung base . 2. Stable cardiomegaly.   Electronically Signed   By: Marcello Moores  Register   On: 02/05/2014 07:43   Dg  Shoulder Right  02/04/2014   CLINICAL DATA:  Anterior right shoulder pain following a fall today.  EXAM: RIGHT SHOULDER - 2+ VIEW  COMPARISON:  None.  FINDINGS: Diffuse osteopenia. Old, probable old, healed right humeral shaft fracture. No acute fracture or dislocation seen. Small amount of linear atelectasis or scarring in the right mid to lower lung zone. Thoracic spine degenerative changes and calcified mediastinal lymph nodes.  IMPRESSION: No fracture or dislocation.   Electronically Signed   By: Enrique Sack M.D.   On: 02/04/2014 18:21   Ct Head Wo  Contrast  02/04/2014   CLINICAL DATA:  Injury.  EXAM: CT HEAD WITHOUT CONTRAST  TECHNIQUE: Contiguous axial images were obtained from the base of the skull through the vertex without intravenous contrast.  COMPARISON:  Head CT 03/15/2009.  FINDINGS: A 2.1 x 1.7 cm left alignment mass is present in with surrounding edema and compression of the third ventricle. Minimal midline shift from left to right is present. No hemorrhage. No hydrocephalus. Scalp laceration and hematoma noted right frontal region. No underlying bony abnormality.  IMPRESSION: 1. 2.1 x 1.7 cm left splenic mass with surrounding edema and mild compression of the third ventricle. Mild midline shift from left to right. This could represent a malignancy. MRI the brain with scaling enhancement should be considered. 2. Right frontal scalp laceration and hematoma. No underlying fracture. These results were called by telephone at the time of interpretation on 02/04/2014 at 7:28 PM to Dr. Blanchie Dessert , who verbally acknowledged these results.   Electronically Signed   By: Marcello Moores  Register   On: 02/04/2014 19:29       MEDICATIONS                                                                                                                        Scheduled: . aspirin  300 mg Rectal Daily   Or  . aspirin  325 mg Oral Daily  . atorvastatin  10 mg Oral Daily  . enoxaparin (LOVENOX) injection  40 mg Subcutaneous Q24H  . levothyroxine  25 mcg Oral QAC breakfast  . multivitamin with minerals  1 tablet Oral Daily  . pantoprazole  40 mg Oral Daily  . potassium chloride  20 mEq Oral BID    ASSESSMENT/PLAN:                                                                                                            Chronic unstable gait with recurrent falls as well as an abnormality involving left  thalamic region indicative of mass lesion with surrounding edema. MRI studies pending.    Recommend continuing physical therapy intervention for  patient's unstable gait. Further management to be formulated when MRI study has been completed.         Assessment and plan discussed with with attending physician and they are in agreement.    Etta Quill PA-C [facility] [phone]  02/06/2014, 9:55 AM

## 2014-02-06 NOTE — Progress Notes (Signed)
[facility] PROGRESS NOTE  KANDY TOWERY ZOX:[id number] DOB: May 19, 1920 DOA: 02/04/2014 PCP: Nadeen Landau., MD  Assessment/Plan: 1. left thalamic mass with surrounding edema versus CVA by CT scan -2-D echo no embolic source and carotid Doppler no significant ICA stenosis>> results as below -MRI still pending, follow -Continue aspirin Lipitor -Appreciate neuro assistance, follow. 2. Status post fall with right frontal laceration and right shoulder injury status post negative right shoulder x-ray  -Continue supportive care 3. Hypertension  -Continue Holding lisinopril for now to allow for permissive hypertension, labetalol IV when necessary follow 4. Dementia   Code Status: Full Family Communication: Daughter at bedside Disposition Plan: Pending clinical course   Consultants:  Neurology  Procedures:  Echocardiogram Study Conclusions  - Left ventricle: The cavity size was normal. Wall thickness was increased in a pattern of mild LVH. Systolic function was vigorous. The estimated ejection fraction was in the range of 65% to 70%. Wall motion was normal; there were no regional wall motion abnormalities. Left ventricular diastolic function parameters were normal. - Aortic valve: Mild regurgitation. - Atrial septum: No defect or patent foramen ovale was identified. - Tricuspid valve: Moderate regurgitation. - Pulmonary arteries: PA peak pressure: 15mm Hg (S). Impressions:  - No cardiac source of emboli was indentified. Transthoracic echocardiography.  Carotid Doppler Summary: Bilateral: intimal wall thickening CCA. Mild soft plaque origin and proximal ICA. 1-39% ICA stenosis. Vertebral artery flow is antegrade.  Antibiotics:  none  HPI/Subjective: Patient sitting up in chair, denies any complaints. Daughter at bedside.  Objective: Filed Vitals:   02/06/14 0859  BP: 155/52  Pulse: 55  Temp: 98.1 F (36.7 C)  Resp: 18    Intake/Output Summary  (Last 24 hours) at 02/06/14 1604 Last data filed at 02/06/14 0830  Gross per 24 hour  Intake    360 ml  Output      0 ml  Net    360 ml   Filed Weights   02/04/14 1730 02/05/14 0000  Weight: 54.25 kg (119 lb 9.6 oz) 56.8 kg (125 lb 3.5 oz)    Exam:  General: alert & disoriented, answers some simple questions appropriately In NAD   Cardiovascular: RRR, nl S1 s2 Respiratory: CTAB Abdomen: soft +BS NT/ND, no masses palpable Extremities: No cyanosis and no edema    Data Reviewed: Basic Metabolic Panel:  Recent Labs Lab 02/04/14 2101  NA 141  K 3.4*  CL 103  CO2 27  GLUCOSE 84  BUN 12  CREATININE 0.95  CALCIUM 8.8   Liver Function Tests: No results found for this basename: AST, ALT, ALKPHOS, BILITOT, PROT, ALBUMIN,  in the last 168 hours No results found for this basename: LIPASE, AMYLASE,  in the last 168 hours No results found for this basename: AMMONIA,  in the last 168 hours CBC:  Recent Labs Lab 02/04/14 2101  WBC 3.9*  NEUTROABS 2.6  HGB 12.7  HCT 38.2  MCV 85.1  PLT 156   Cardiac Enzymes: No results found for this basename: CKTOTAL, CKMB, CKMBINDEX, TROPONINI,  in the last 168 hours BNP (last 3 results) No results found for this basename: PROBNP,  in the last 8760 hours CBG: No results found for this basename: GLUCAP,  in the last 168 hours  No results found for this or any previous visit (from the past 240 hour(s)).   Studies: Dg Chest 2 View  02/05/2014   CLINICAL DATA:  Stroke.  EXAM: CHEST  2 VIEW  COMPARISON:  DG CHEST 2 VIEW dated  09/13/2012  FINDINGS: Mediastinum and hilar structures are normal. Subsegmental atelectasis in the lung bases. Mild developing infiltrate left lung base cannot be excluded. No pleural effusion or pneumothorax. Stable cardiomegaly. Normal pulmonary vascularity. No pneumothorax. Surgical clips left chest.  IMPRESSION: 1. Mild bibasilar atelectasis with possible developing small infiltrate left lung base . 2. Stable  cardiomegaly.   Electronically Signed   By: Marcello Moores  Register   On: 02/05/2014 07:43   Dg Shoulder Right  02/04/2014   CLINICAL DATA:  Anterior right shoulder pain following a fall today.  EXAM: RIGHT SHOULDER - 2+ VIEW  COMPARISON:  None.  FINDINGS: Diffuse osteopenia. Old, probable old, healed right humeral shaft fracture. No acute fracture or dislocation seen. Small amount of linear atelectasis or scarring in the right mid to lower lung zone. Thoracic spine degenerative changes and calcified mediastinal lymph nodes.  IMPRESSION: No fracture or dislocation.   Electronically Signed   By: Enrique Sack M.D.   On: 02/04/2014 18:21   Ct Head Wo Contrast  02/06/2014   ADDENDUM REPORT: 02/06/2014 13:13  ADDENDUM: Head CT report 02/04/2014 should read left thalamic mass ,not splenic mass.   Electronically Signed   By: Marcello Moores  Register   On: 02/06/2014 13:13   02/06/2014   CLINICAL DATA:  Injury.  EXAM: CT HEAD WITHOUT CONTRAST  TECHNIQUE: Contiguous axial images were obtained from the base of the skull through the vertex without intravenous contrast.  COMPARISON:  Head CT 03/15/2009.  FINDINGS: A 2.1 x 1.7 cm left alignment mass is present in with surrounding edema and compression of the third ventricle. Minimal midline shift from left to right is present. No hemorrhage. No hydrocephalus. Scalp laceration and hematoma noted right frontal region. No underlying bony abnormality.  IMPRESSION: 1. 2.1 x 1.7 cm left splenic mass with surrounding edema and mild compression of the third ventricle. Mild midline shift from left to right. This could represent a malignancy. MRI the brain with scaling enhancement should be considered. 2. Right frontal scalp laceration and hematoma. No underlying fracture. These results were called by telephone at the time of interpretation on 02/04/2014 at 7:28 PM to Dr. Blanchie Dessert , who verbally acknowledged these results.  Electronically Signed: By: Marcello Moores  Register On: 02/04/2014 19:29   Mr  Jeri Cos Wo Contrast  02/06/2014   CLINICAL DATA:  80 year old female with abnormal head CT recently. Fall with scalp hematoma. Pain. Initial encounter.  EXAM: MRI HEAD WITHOUT AND WITH CONTRAST  TECHNIQUE: Multiplanar, multiecho pulse sequences of the brain and surrounding structures were obtained without and with intravenous contrast.  CONTRAST:  58mL MULTIHANCE GADOBENATE DIMEGLUMINE 529 MG/ML IV SOLN  COMPARISON:  Head CTs without contrast 02/04/2014 and earlier.  FINDINGS: The recent head CT finding corresponds to mass like heterogeneous T2 signal abnormality centered in the left thalamus measuring up to 36 x 24 x 20 (AP by transverse by CC). The signal abnormality appears relatively confined to the thalamus, not definitely tracking into the midbrain or deep white matter capsules. The area is heterogeneously decreased on precontrast T1 weighted imaging. Post-contrast images demonstrate a nodular 4 mm focus of enhancement along the anterior superior aspect of the lesion. Small curvilinear enhancement elsewhere in the lesion (e.g. Series 13, image 26) appears to be related to normal vasculature. Diffusion is facilitated within the mass.  There is also a small abnormal focus of enhancement in the posterior right occipital lobe, cortically based and measuring 6 mm diameter. No associated edema.  Furthermore, there is a  lobulated extra-axial appearing lesion at the posterior planum sphenoidale best seen on sagittal images (series 4 image 11 versus series 15, image 11) encompassing 16 x 16 x 10 mm). On axial images this does not appear to affect the infundibulum. On sagittal images it appears separate from the pituitary. No definite involvement of the optic chiasm or optic nerves.  Questionable additional small dural based lesion along the left superior frontal convexity (series 13, image 42 and sagittal series 15, image 16). This might be artifact related to a small dural vessel.  No restricted diffusion or evidence  of acute infarction. Major intracranial vascular flow voids are preserved. No midline shift, mass effect, ventriculomegaly, extra-axial collection or acute intracranial hemorrhage. Cervicomedullary junction and pituitary are within normal limits. Negative visualized cervical spine. Normal bone marrow signal. Major intracranial vascular flow voids are preserved. Postoperative changes to the globes. Right periorbital scalp hematoma has mildly regressed.  Additional patchy mild for age periventricular white matter nonspecific T2 and FLAIR hyperintensity.  IMPRESSION: 1. Heterogeneous mass like signal abnormality in the left thalamus, overall measures up to 3.6 cm but includes a small 4 mm nodule of enhancement. No significant intracranial mass effect. 2. Subtle abnormal right occipital lobe cortical enhancing lesion measuring 6 mm. 3. Lobulated abnormal enhancing soft tissue at the central planum sphenoidale. A questionable fourth small 5-6 mm dural based lesion along the left posterior convexity. 4. Constellation of findings is most suggestive of metastatic disease to the brain. Less likely, the lesions in #3 might be inconsequential meningiomas.   Electronically Signed   By: Lars Pinks M.D.   On: 02/06/2014 13:30    Scheduled Meds: . aspirin  300 mg Rectal Daily   Or  . aspirin  325 mg Oral Daily  . atorvastatin  10 mg Oral Daily  . enoxaparin (LOVENOX) injection  40 mg Subcutaneous Q24H  . levothyroxine  25 mcg Oral QAC breakfast  . multivitamin with minerals  1 tablet Oral Daily  . pantoprazole  40 mg Oral Daily  . potassium chloride  20 mEq Oral BID   Continuous Infusions:   Principal Problem:   Brain mass Active Problems:   Fall   CVA (cerebral vascular accident)    Time spent: [facility] Pager [phone]. If 7PM-7AM, please contact night-coverage at [email or web address], password [facility] 02/06/2014, 4:04 PM  LOS: 2 days

## 2014-02-06 NOTE — Progress Notes (Signed)
Nutrition Brief Note  Patient identified on the Malnutrition Screening Tool (MST) Report  Wt Readings from Last 15 Encounters:  02/05/14 125 lb 3.5 oz (56.8 kg)  03/01/13 120 lb 9.6 oz (54.704 kg)  03/07/11 132 lb 3.2 oz (59.966 kg)  10/17/08 151 lb 4 oz (68.607 kg)  08/15/08 147 lb 2.1 oz (66.738 kg)    Body mass index is 20.84 kg/(m^2). Patient meets criteria for Normal Weight based on current BMI. Per pt's daughter at bedside pt has a poor appetite PTA but, since admission pt's appetite has been very good and pt is eating 100% of most meals. Per daughter pt weight 145 lbs years ago. Per weight history, pt's weight has been stable for the past year.   Current diet order is Heart Healthy, patient is consuming approximately 100% of meals at this time. Encouraged pt to consume Ensure Plus supplements daily if her appetite decreases again at home; discussed with daughter. Labs and medications reviewed.   No nutrition interventions warranted at this time. If nutrition issues arise, please consult RD.   Pryor Ochoa RD, LDN Inpatient Clinical Dietitian Pager: [phone] After Hours Pager: [phone]

## 2014-02-06 NOTE — Progress Notes (Signed)
  Echocardiogram 2D Echocardiogram has been performed.  Carney Corners 02/06/2014, 1:46 PM

## 2014-02-06 NOTE — Evaluation (Signed)
SLP Cancellation Note  Patient Details Name: Kerry Wells MRN: [id number] DOB: [birth year]   Cancelled treatment:       Reason Eval/Treat Not Completed: Other (comment) (daughter declined to have evaluation compelted stating pt at baseline and has premorbid memory deficits)   Claudie Fisherman, MS [facility] SLP [phone]

## 2014-02-06 NOTE — Progress Notes (Signed)
OT Discharge Note  Patient Details Name: Kerry Wells MRN: [id number] DOB: [birth year]   Cancelled Treatment:    Reason Eval/Treat Not Completed: OT screened, no needs identified, will sign off. Per daughter present in room, patient is near baseline for adls. Pt at baseline requires (A) for tub transfer. Ot to sign off acutely per family request.   Recommending RW - family requesting  Pt observed self feeding MOD I  Peri Maris Pager: [phone] 13:57-14:05 spent with daughter 02/06/2014, 2:04 PM

## 2014-02-07 ENCOUNTER — Inpatient Hospital Stay (HOSPITAL_COMMUNITY): Payer: Medicare Other

## 2014-02-07 ENCOUNTER — Encounter (HOSPITAL_COMMUNITY): Payer: Self-pay | Admitting: Radiology

## 2014-02-07 LAB — BASIC METABOLIC PANEL
BUN: 11 mg/dL (ref 6–23)
CALCIUM: 8.3 mg/dL — AB (ref 8.4–10.5)
CO2: 27 meq/L (ref 19–32)
CREATININE: 0.99 mg/dL (ref 0.50–1.10)
Chloride: 108 mEq/L (ref 96–112)
GFR calc Af Amer: 56 mL/min — ABNORMAL LOW (ref >90)
GFR calc non Af Amer: 48 mL/min — ABNORMAL LOW (ref >90)
Glucose, Bld: 81 mg/dL (ref 70–99)
Potassium: 4.3 mEq/L (ref 3.7–5.3)
Sodium: 145 mEq/L (ref 137–147)

## 2014-02-07 MED ORDER — IOHEXOL 300 MG/ML  SOLN
25.0000 mL | INTRAMUSCULAR | Status: AC
  Administered 2014-02-07 (×2): 25 mL via ORAL
  Filled 2014-02-07: qty 30

## 2014-02-07 MED ORDER — RISPERIDONE 0.5 MG PO TABS
0.5000 mg | ORAL_TABLET | Freq: Every day | ORAL | Status: DC
  Administered 2014-02-07: 0.5 mg via ORAL
  Filled 2014-02-07 (×2): qty 1

## 2014-02-07 MED ORDER — IOHEXOL 300 MG/ML  SOLN
100.0000 mL | Freq: Once | INTRAMUSCULAR | Status: AC | PRN
  Administered 2014-02-07: 100 mL via INTRAVENOUS

## 2014-02-07 NOTE — Progress Notes (Signed)
Physical Therapy Treatment Patient Details Name: Kerry Wells MRN: [id number] DOB: June 24, 1920 Today's Date: 02/07/2014    History of Present Illness Kerry Wells is a 80 y.o. female with a history of mild dementia as well as several days of increased unsteadiness. She had a fall this evening and therefore was brought to the emergency room. In the emergency room, she had a CT of her head which shows a thalamic hypodensity in the left. MRI suggestive of metastatic brain lesion.      PT Comments    Pt is progressing well with her mobility.  She continues to show signs of imbalance even with use of RW which put her at risk for further falls.  She continues to be appropriate for HHPT f/u at d/c.  PT will continue to follow acutely.   Follow Up Recommendations  Home health PT;Supervision/Assistance - 24 hour     Equipment Recommendations  None recommended by PT    Recommendations for Other Services   NA     Precautions / Restrictions Precautions Precautions: Fall Precaution Comments: multiple falls  Required Braces or Orthoses: Sling (for comfort of R arm, not used today)    Mobility  Bed Mobility Overal bed mobility: Needs Assistance Bed Mobility: Supine to Sit;Sit to Supine     Supine to sit: Modified independent (Device/Increase time);HOB elevated Sit to supine: Min assist   General bed mobility comments: used railing for leverage, HOB mildly elevated ~20 degrees.  Extra time needed to complete transitions.  Min assist to get one leg back into the bed.   Transfers Overall transfer level: Needs assistance Equipment used: Rolling walker (2 wheeled) Transfers: Sit to/from Stand           General transfer comment: Min guard assist for safety  Ambulation/Gait Ambulation/Gait assistance: Min guard Ambulation Distance (Feet): 150 Feet Assistive device: Rolling walker (2 wheeled) Gait Pattern/deviations: Step-through pattern;Shuffle;Decreased stride length;Narrow  base of support     General Gait Details: Min guard assist for balance and safety during gait.  Verbal cues for safe use of RW and to spread feet further apart.  Pt tends to get outside of RW while turning.    Stairs Stairs: Yes Stairs assistance: Min guard Stair Management: Two rails;Alternating pattern;Forwards Number of Stairs: 5 General stair comments: Min guard assist for safety      Balance Overall balance assessment: Needs assistance Sitting-balance support: Feet supported;No upper extremity supported Sitting balance-Leahy Scale: Normal     Standing balance support: Single extremity supported;Bilateral upper extremity supported Standing balance-Leahy Scale: Fair                      Cognition Arousal/Alertness: Awake/alert Behavior During Therapy: WFL for tasks assessed/performed Overall Cognitive Status: History of cognitive impairments - at baseline                      Exercises General Exercises - Upper Extremity Shoulder Flexion: AAROM;Both;10 reps;Seated Elbow Flexion: AAROM;Both;10 reps;Seated General Exercises - Lower Extremity Long Arc Quad: AROM;Both;10 reps;Seated Hip ABduction/ADduction: AROM;Both;10 reps;Seated (adduction only against pillow for resistance) Hip Flexion/Marching: AROM;Both;10 reps;Seated Toe Raises: AROM;Both;10 reps;Seated Heel Raises: AROM;Both;10 reps;Seated        Pertinent Vitals/Pain See vitals flow sheet.            PT Goals (current goals can now be found in the care plan section) Acute Rehab PT Goals Patient Stated Goal: to go home  Progress towards PT goals: Progressing  toward goals    Frequency  Min 3X/week    PT Plan Current plan remains appropriate       End of Session Equipment Utilized During Treatment: Gait belt Activity Tolerance: Patient tolerated treatment well Patient left: in bed;with call bell/phone within reach;with family/visitor present     Time: 1342-1400 PT Time  Calculation (min): 18 min  Charges:  $Gait Training: 8-22 mins                      Donnamaria Shands B. [place], [place], DPT [phone]   02/07/2014, 2:09 PM

## 2014-02-07 NOTE — Progress Notes (Signed)
NEURO HOSPITALIST PROGRESS NOTE   SUBJECTIVE:                                                                                                                        Patient has no complaints.  MRI results explained to both patient and daughter.   OBJECTIVE:                                                                                                                           Vital signs in last 24 hours: Temp:  [97.5 F (36.4 C)-98.2 F (36.8 C)] 98.2 F (36.8 C) (05/05 0625) Pulse Rate:  [50-57] 50 (05/05 0625) Resp:  [16-18] 18 (05/05 0625) BP: (165-175)/(50-62) 166/50 mmHg (05/05 0625) SpO2:  [99 %-100 %] 100 % (05/05 0625)  Intake/Output from previous day: 05/04 0701 - 05/05 0700 In: 120 [P.O.:120] Out: -  Intake/Output this shift:   Nutritional status: Cardiac  Past Medical History  Diagnosis Date  . Breast cancer   . Hypertension   . Hyperlipidemia   . GERD (gastroesophageal reflux disease)   . Diverticulosis   . Cholelithiasis   . Gout   . Osteoporosis   . Anemia   . Depression   . Hemorrhoids   . Insomnia   . Hypothyroidism   . Hiatal hernia   . Breast cancer 2000  . Chronic constipation   . Unspecified urinary incontinence   . Edema   . Encounter for long-term (current) use of other medications   . Unspecified venous (peripheral) insufficiency   . Nonspecific abnormal results of liver function study   . Gout, unspecified   . Memory loss   . Disorders of bilirubin excretion   . Neutropenia, unspecified   . Anemia, unspecified   . Other specified cardiac dysrhythmias(427.89)   . Hypopotassemia   . Disorder of bone and cartilage, unspecified   . Postmastectomy lymphedema syndrome   . Diverticulitis of colon (without mention of hemorrhage)      Neurologic Exam:  Mental Status:  Alert, not oriented to date, year and month, intent on watching No evidence of aphasia. Able to follow 3 step commands without  difficulty.  Cranial Nerves:  II: Visual fields grossly normal, pupils equal, round, reactive  to light and accommodation  III,IV, VI: ptosis not present, extra-ocular motions intact bilaterally  V,VII: smile symmetric, facial light touch sensation normal bilaterally  VIII: hearing normal bilaterally  IX,X: gag reflex present  XI: bilateral shoulder shrug  XII: midline tongue extension without atrophy or fasciculations  Motor:  5/5 througout Sensory: Pinprick and light touch intact throughout, bilaterally  Deep Tendon Reflexes:  2+ throughout UE and 1+ in LE  Plantars:  Right: downgoing  Left: downgoing       Lab Results: Basic Metabolic Panel:  Recent Labs Lab 02/04/14 2101 02/07/14 0600  NA 141 145  K 3.4* 4.3  CL 103 108  CO2 27 27  GLUCOSE 84 81  BUN 12 11  CREATININE 0.95 0.99  CALCIUM 8.8 8.3*    Liver Function Tests: No results found for this basename: AST, ALT, ALKPHOS, BILITOT, PROT, ALBUMIN,  in the last 168 hours No results found for this basename: LIPASE, AMYLASE,  in the last 168 hours No results found for this basename: AMMONIA,  in the last 168 hours  CBC:  Recent Labs Lab 02/04/14 2101  WBC 3.9*  NEUTROABS 2.6  HGB 12.7  HCT 38.2  MCV 85.1  PLT 156    Cardiac Enzymes: No results found for this basename: CKTOTAL, CKMB, CKMBINDEX, TROPONINI,  in the last 168 hours  Lipid Panel:  Recent Labs Lab 02/05/14 0739  CHOL 168  TRIG 46  HDL 64  CHOLHDL 2.6  VLDL 9  LDLCALC 95    CBG: No results found for this basename: GLUCAP,  in the last 168 hours  Microbiology: Results for orders placed during the hospital encounter of 07/30/08  URINE CULTURE     Status: None   Collection Time    07/30/08  7:48 AM      Result Value Ref Range Status   Specimen Description URINE, RANDOM   Final   Special Requests NONE   Final   Colony Count >=100,000 COLONIES/ML   Final   Culture     Final   Value: Multiple bacterial morphotypes present, none  predominant. Suggest appropriate recollection if clinically indicated.   Report Status 08/01/2008 FINAL   Final    Coagulation Studies: No results found for this basename: LABPROT, INR,  in the last 72 hours  Imaging: Mr Kizzie Fantasia Contrast  02/06/2014   CLINICAL DATA:  80 year old female with abnormal head CT recently. Fall with scalp hematoma. Pain. Initial encounter.  EXAM: MRI HEAD WITHOUT AND WITH CONTRAST  TECHNIQUE: Multiplanar, multiecho pulse sequences of the brain and surrounding structures were obtained without and with intravenous contrast.  CONTRAST:  68mL MULTIHANCE GADOBENATE DIMEGLUMINE 529 MG/ML IV SOLN  COMPARISON:  Head CTs without contrast 02/04/2014 and earlier.  FINDINGS: The recent head CT finding corresponds to mass like heterogeneous T2 signal abnormality centered in the left thalamus measuring up to 36 x 24 x 20 (AP by transverse by CC). The signal abnormality appears relatively confined to the thalamus, not definitely tracking into the midbrain or deep white matter capsules. The area is heterogeneously decreased on precontrast T1 weighted imaging. Post-contrast images demonstrate a nodular 4 mm focus of enhancement along the anterior superior aspect of the lesion. Small curvilinear enhancement elsewhere in the lesion (e.g. Series 13, image 26) appears to be related to normal vasculature. Diffusion is facilitated within the mass.  There is also a small abnormal focus of enhancement in the posterior right occipital lobe, cortically based and measuring 6 mm diameter. No  associated edema.  Furthermore, there is a lobulated extra-axial appearing lesion at the posterior planum sphenoidale best seen on sagittal images (series 4 image 11 versus series 15, image 11) encompassing 16 x 16 x 10 mm). On axial images this does not appear to affect the infundibulum. On sagittal images it appears separate from the pituitary. No definite involvement of the optic chiasm or optic nerves.   Questionable additional small dural based lesion along the left superior frontal convexity (series 13, image 42 and sagittal series 15, image 16). This might be artifact related to a small dural vessel.  No restricted diffusion or evidence of acute infarction. Major intracranial vascular flow voids are preserved. No midline shift, mass effect, ventriculomegaly, extra-axial collection or acute intracranial hemorrhage. Cervicomedullary junction and pituitary are within normal limits. Negative visualized cervical spine. Normal bone marrow signal. Major intracranial vascular flow voids are preserved. Postoperative changes to the globes. Right periorbital scalp hematoma has mildly regressed.  Additional patchy mild for age periventricular white matter nonspecific T2 and FLAIR hyperintensity.  IMPRESSION: 1. Heterogeneous mass like signal abnormality in the left thalamus, overall measures up to 3.6 cm but includes a small 4 mm nodule of enhancement. No significant intracranial mass effect. 2. Subtle abnormal right occipital lobe cortical enhancing lesion measuring 6 mm. 3. Lobulated abnormal enhancing soft tissue at the central planum sphenoidale. A questionable fourth small 5-6 mm dural based lesion along the left posterior convexity. 4. Constellation of findings is most suggestive of metastatic disease to the brain. Less likely, the lesions in #3 might be inconsequential meningiomas.   Electronically Signed   By: Lars Pinks M.D.   On: 02/06/2014 13:30       MEDICATIONS                                                                                                                        Scheduled: . aspirin  300 mg Rectal Daily   Or  . aspirin  325 mg Oral Daily  . atorvastatin  10 mg Oral Daily  . enoxaparin (LOVENOX) injection  40 mg Subcutaneous Q24H  . levothyroxine  25 mcg Oral QAC breakfast  . multivitamin with minerals  1 tablet Oral Daily  . pantoprazole  40 mg Oral Daily  . potassium chloride  20  mEq Oral BID    ASSESSMENT/PLAN:                                                                                                            Chronic unstable gait  with recurrent falls as well as an abnormality involving left thalamic region indicative of mass lesion with surrounding edema. MRI suggestive of metastatic brain lesion.   Patient will need CT chest,ABD, and pelvis to evaluate for source then oncology consult.   No further recommendations per neurology.  Neurology will S/O  Discussed with Dr. Dillard Essex.  Assessment and plan discussed with with attending physician and they are in agreement.    Etta Quill PA-C [facility] [phone]  02/07/2014, 10:09 AM

## 2014-02-07 NOTE — Progress Notes (Signed)
[facility] PROGRESS NOTE  Kerry Wells ZOX:[id number] DOB: [birth year] DOA: 02/04/2014 PCP: Nadeen Landau., MD Brief narrative Kerry Wells is a 80 y.o. female, with history of breast cancer, hypertension and hyperlipidemia who is presenting to our emergency room today after a fall and sustaining a right frontal laceration. CT of the head showed a probable left thalamic mass versus CVA and an MRI of the brain was ordered. She was admitted for further evaluation and management.  Assessment/Plan: 1. left thalamic mass with surrounding edema versus CVA by CT scan -2-D echo no embolic source and carotid Doppler no significant ICA stenosis>> results as below -MRI with heterogeneous masslike abnormality in the left thalamus measuring up to 3.6cm, no significant intracranial mass effect and other constellation of findings most suggestive of metastatic disease to the brain -Have ordered CT scan of abdomen and chest as per neuro recommendations, results discussed with patient/daughter at bedside -Continue aspirin Lipitor -Follow and consult oncology pending results of CT scans and palliative care consultation may also need to be considered pending results of the CT scans. 2. Status post fall with right frontal laceration and right shoulder injury status post negative right shoulder x-ray  -Continue supportive care 3. Hypertension  -Will resume lisinopril and follow. 4. Dementia -Will add risperidone  Code Status: Full Family Communication: Daughter at bedside Disposition Plan: Pending clinical course   Consultants:  Neurology  Procedures:  Echocardiogram Study Conclusions  - Left ventricle: The cavity size was normal. Wall thickness was increased in a pattern of mild LVH. Systolic function was vigorous. The estimated ejection fraction was in the range of 65% to 70%. Wall motion was normal; there were no regional wall motion abnormalities. Left ventricular diastolic  function parameters were normal. - Aortic valve: Mild regurgitation. - Atrial septum: No defect or patent foramen ovale was identified. - Tricuspid valve: Moderate regurgitation. - Pulmonary arteries: PA peak pressure: 46mm Hg (S). Impressions:  - No cardiac source of emboli was indentified. Transthoracic echocardiography.  Carotid Doppler Summary: Bilateral: intimal wall thickening CCA. Mild soft plaque origin and proximal ICA. 1-39% ICA stenosis. Vertebral artery flow is antegrade.  Antibiotics:  none  HPI/Subjective: Patient sitting up in bed, per daughter at bedside she did not sleep last PM  Objective: Filed Vitals:   02/07/14 1000  BP: 166/51  Pulse: 60  Temp: 97.8 F (36.6 C)  Resp: 16    Intake/Output Summary (Last 24 hours) at 02/07/14 1131 Last data filed at 02/07/14 0800  Gross per 24 hour  Intake    120 ml  Output      0 ml  Net    120 ml   Filed Weights   02/04/14 1730 02/05/14 0000  Weight: 54.25 kg (119 lb 9.6 oz) 56.8 kg (125 lb 3.5 oz)    Exam:  General: alert & disoriented,  In NAD   Cardiovascular: RRR, nl S1 s2 Respiratory: CTAB Abdomen: soft +BS NT/ND, no masses palpable Extremities: No cyanosis and no edema    Data Reviewed: Basic Metabolic Panel:  Recent Labs Lab 02/04/14 2101 02/07/14 0600  NA 141 145  K 3.4* 4.3  CL 103 108  CO2 27 27  GLUCOSE 84 81  BUN 12 11  CREATININE 0.95 0.99  CALCIUM 8.8 8.3*   Liver Function Tests: No results found for this basename: AST, ALT, ALKPHOS, BILITOT, PROT, ALBUMIN,  in the last 168 hours No results found for this basename: LIPASE, AMYLASE,  in the last 168 hours No results found  for this basename: AMMONIA,  in the last 168 hours CBC:  Recent Labs Lab 02/04/14 2101  WBC 3.9*  NEUTROABS 2.6  HGB 12.7  HCT 38.2  MCV 85.1  PLT 156   Cardiac Enzymes: No results found for this basename: CKTOTAL, CKMB, CKMBINDEX, TROPONINI,  in the last 168 hours BNP (last 3 results) No  results found for this basename: PROBNP,  in the last 8760 hours CBG: No results found for this basename: GLUCAP,  in the last 168 hours  No results found for this or any previous visit (from the past 240 hour(s)).   Studies: Mr Kizzie Fantasia Contrast  02-17-14   CLINICAL DATA:  80 year old female with abnormal head CT recently. Fall with scalp hematoma. Pain. Initial encounter.  EXAM: MRI HEAD WITHOUT AND WITH CONTRAST  TECHNIQUE: Multiplanar, multiecho pulse sequences of the brain and surrounding structures were obtained without and with intravenous contrast.  CONTRAST:  61mL MULTIHANCE GADOBENATE DIMEGLUMINE 529 MG/ML IV SOLN  COMPARISON:  Head CTs without contrast 02/04/2014 and earlier.  FINDINGS: The recent head CT finding corresponds to mass like heterogeneous T2 signal abnormality centered in the left thalamus measuring up to 36 x 24 x 20 (AP by transverse by CC). The signal abnormality appears relatively confined to the thalamus, not definitely tracking into the midbrain or deep white matter capsules. The area is heterogeneously decreased on precontrast T1 weighted imaging. Post-contrast images demonstrate a nodular 4 mm focus of enhancement along the anterior superior aspect of the lesion. Small curvilinear enhancement elsewhere in the lesion (e.g. Series 13, image 26) appears to be related to normal vasculature. Diffusion is facilitated within the mass.  There is also a small abnormal focus of enhancement in the posterior right occipital lobe, cortically based and measuring 6 mm diameter. No associated edema.  Furthermore, there is a lobulated extra-axial appearing lesion at the posterior planum sphenoidale best seen on sagittal images (series 4 image 11 versus series 15, image 11) encompassing 16 x 16 x 10 mm). On axial images this does not appear to affect the infundibulum. On sagittal images it appears separate from the pituitary. No definite involvement of the optic chiasm or optic nerves.   Questionable additional small dural based lesion along the left superior frontal convexity (series 13, image 42 and sagittal series 15, image 16). This might be artifact related to a small dural vessel.  No restricted diffusion or evidence of acute infarction. Major intracranial vascular flow voids are preserved. No midline shift, mass effect, ventriculomegaly, extra-axial collection or acute intracranial hemorrhage. Cervicomedullary junction and pituitary are within normal limits. Negative visualized cervical spine. Normal bone marrow signal. Major intracranial vascular flow voids are preserved. Postoperative changes to the globes. Right periorbital scalp hematoma has mildly regressed.  Additional patchy mild for age periventricular white matter nonspecific T2 and FLAIR hyperintensity.  IMPRESSION: 1. Heterogeneous mass like signal abnormality in the left thalamus, overall measures up to 3.6 cm but includes a small 4 mm nodule of enhancement. No significant intracranial mass effect. 2. Subtle abnormal right occipital lobe cortical enhancing lesion measuring 6 mm. 3. Lobulated abnormal enhancing soft tissue at the central planum sphenoidale. A questionable fourth small 5-6 mm dural based lesion along the left posterior convexity. 4. Constellation of findings is most suggestive of metastatic disease to the brain. Less likely, the lesions in #3 might be inconsequential meningiomas.   Electronically Signed   By: Lars Pinks M.D.   On: 02/17/2014 13:30    Scheduled Meds: .  aspirin  300 mg Rectal Daily   Or  . aspirin  325 mg Oral Daily  . atorvastatin  10 mg Oral Daily  . enoxaparin (LOVENOX) injection  40 mg Subcutaneous Q24H  . levothyroxine  25 mcg Oral QAC breakfast  . multivitamin with minerals  1 tablet Oral Daily  . pantoprazole  40 mg Oral Daily  . potassium chloride  20 mEq Oral BID  . risperiDONE  0.5 mg Oral QHS   Continuous Infusions:   Principal Problem:   Brain mass Active Problems:    Fall   CVA (cerebral vascular accident)    Time spent: [facility] Pager [phone]. If 7PM-7AM, please contact night-coverage at [email or web address], password [facility] 02/07/2014, 11:31 AM  LOS: 3 days

## 2014-02-08 MED ORDER — DEXAMETHASONE 4 MG PO TABS: 4.0000 mg | ORAL_TABLET | Freq: Every day | ORAL | Status: DC

## 2014-02-08 MED ORDER — DEXAMETHASONE 4 MG PO TABS
4.0000 mg | ORAL_TABLET | Freq: Every day | ORAL | Status: DC
  Filled 2014-02-08 (×3): qty 1

## 2014-02-08 NOTE — Care Management Note (Signed)
    Page 1 of 2   02/08/2014     3:22:01 PM CARE MANAGEMENT NOTE 02/08/2014  Patient:  Kerry Wells, Kerry Wells   Account Number:  [id number]  Date Initiated:  02/08/2014  Documentation initiated by:  Lorne Skeens  Subjective/Objective Assessment:   Patient was admitted for CVA, fall. Lives at home with adult children.     Action/Plan:   Will follow for discharge needs pending PT/OT evals and physician orders   Anticipated DC Date:  02/08/2014   Anticipated DC Plan:  [place]  CM consult      Choice offered to / List presented to:  C-4 Adult Children   DME arranged  [place]      DME agency  [place] arranged  [place].   Status of service:  Completed, signed off Medicare Important Message given?   (If response is "NO", the following Medicare IM given date fields will be blank) Date Medicare IM given:   Date Additional Medicare IM given:    Discharge Disposition:  [place]  Per UR Regulation:  Reviewed for med. necessity/level of care/duration of stay  If discussed at [place] of Stay Meetings, dates discussed:    Comments:  02/08/14 [place], MSN, CM- Met with patient and daughter to discuss home health needs.  Daughter has chosen [facility] for HHPT/OT/NA.  Preferred contact number is [phone], second contact is daughter's cell [phone].  Colletta [place] with [facility] was notified and has accepted the referral for discharge home today.  [facility] DME was notified of need for equipment prior to disccharge.

## 2014-02-08 NOTE — Progress Notes (Signed)
Discharge instructions given. Pt verbalized understanding and all questions were answered.  

## 2014-02-08 NOTE — Discharge Instructions (Signed)
Follow with Primary MD CHOWBEY,VANDANA P., MD in 7 days   Get CBC, CMP, checked 7 days by Primary MD and again as instructed by your Primary MD.     Activity: As tolerated with Full fall precautions use walker/cane & assistance as needed   Disposition Home     Diet: Heart Healthy with feeding assistance and aspiration precautions if needed.  For Heart failure patients - Check your Weight same time everyday, if you gain over 2 pounds, or you develop in leg swelling, experience more shortness of breath or chest pain, call your Primary MD immediately. Follow Cardiac Low Salt Diet and 1.8 lit/day fluid restriction.   On your next visit with her primary care physician please Get Medicines reviewed and adjusted.  Please request your Prim.MD to go over all Hospital Tests and Procedure/Radiological results at the follow up, please get all Hospital records sent to your Prim MD by signing hospital release before you go home.   If you experience worsening of your admission symptoms, develop shortness of breath, life threatening emergency, suicidal or homicidal thoughts you must seek medical attention immediately by calling 911 or calling your MD immediately  if symptoms less severe.  You Must read complete instructions/literature along with all the possible adverse reactions/side effects for all the Medicines you take and that have been prescribed to you. Take any new Medicines after you have completely understood and accpet all the possible adverse reactions/side effects.   Do not drive and provide baby sitting services if your were admitted for syncope or siezures until you have seen by Primary MD or a Neurologist and advised to do so again.  Do not drive when taking Pain medications.    Do not take more than prescribed Pain, Sleep and Anxiety Medications  Special Instructions: If you have smoked or chewed Tobacco  in the last 2 yrs please stop smoking, stop any regular Alcohol  and or any  Recreational drug use.  Wear Seat belts while driving.   Please note  You were cared for by a hospitalist during your hospital stay. If you have any questions about your discharge medications or the care you received while you were in the hospital after you are discharged, you can call the unit and asked to speak with the hospitalist on call if the hospitalist that took care of you is not available. Once you are discharged, your primary care physician will handle any further medical issues. Please note that NO REFILLS for any discharge medications will be authorized once you are discharged, as it is imperative that you return to your primary care physician (or establish a relationship with a primary care physician if you do not have one) for your aftercare needs so that they can reassess your need for medications and monitor your lab values.

## 2014-02-08 NOTE — Discharge Summary (Signed)
Kerry Wells, is a 80 y.o. female  DOB [birth year]  MRN [id number].  Admission date:  02/04/2014  Admitting Physician  Merton Border, MD  Discharge Date:  02/08/2014   Primary MD  Nadeen Landau., MD  Recommendations for primary care physician for things to follow:   Monitor symptoms clinically, make sure patient follows with recommended neurosurgeon and oncologist one time in the outpatient setting for brain mass workup.   Admission Diagnosis  Essential hypertension, benign [401.1] Fall [E888.9] Brain mass [348.9] Abnormal MRI of head [793.0]   Discharge Diagnosis  Essential hypertension, benign [401.1] Fall [E888.9] Brain mass [348.9] Abnormal MRI of head [793.0]   Principal Problem:   Brain mass Active Problems:   Fall   CVA (cerebral vascular accident)      Past Medical History  Diagnosis Date  . Breast cancer   . Hypertension   . Hyperlipidemia   . GERD (gastroesophageal reflux disease)   . Diverticulosis   . Cholelithiasis   . Gout   . Osteoporosis   . Anemia   . Depression   . Hemorrhoids   . Insomnia   . Hypothyroidism   . Hiatal hernia   . Breast cancer 2000  . Chronic constipation   . Unspecified urinary incontinence   . Edema   . Encounter for long-term (current) use of other medications   . Unspecified venous (peripheral) insufficiency   . Nonspecific abnormal results of liver function study   . Gout, unspecified   . Memory loss   . Disorders of bilirubin excretion   . Neutropenia, unspecified   . Anemia, unspecified   . Other specified cardiac dysrhythmias(427.89)   . Hypopotassemia   . Disorder of bone and cartilage, unspecified   . Postmastectomy lymphedema syndrome   . Diverticulitis of colon (without mention of hemorrhage)     Past Surgical History  Procedure Laterality Date    . Mastectomy  2008    left  . Vaginal hysterectomy    . Fibroid tumor removal    . Abdominal hysterectomy       Discharge Condition: Stable   Follow UP  Follow-up Information   Follow up with CHOWBEY,VANDANA P., MD. Schedule an appointment as soon as possible for a visit in 1 week.   Specialty:  Internal Medicine   Contact information:   [facility] [place] [address] [phone]       Follow up with Consuella Lose, C, MD. Schedule an appointment as soon as possible for a visit in 1 week.   Specialty:  Neurosurgery   Contact information:   [address], [address] [place] [address] [phone]       Follow up with [facility], MD. Schedule an appointment as soon as possible for a visit in 1 week.   Specialty:  Oncology   Contact information:   [address] [place] [address] [phone]         Discharge Instructions  and  Discharge Medications  Discharge Orders   Future Orders Complete By Expires   Diet - low sodium heart healthy  As directed    Discharge instructions  As directed    Scheduling Instructions:   Follow with Primary MD CHOWBEY,VANDANA P., MD in 7 days   Get CBC, CMP, checked 7 days by Primary MD and again as instructed by your Primary MD.     Activity: As tolerated with Full fall precautions use walker/cane & assistance as needed   Disposition Home     Diet: Heart Healthy with feeding assistance and aspiration precautions if needed.  For Heart failure patients - Check your Weight same time everyday, if you gain over 2 pounds, or you develop in leg swelling, experience more shortness of breath or chest pain, call your Primary MD immediately. Follow Cardiac Low Salt Diet and 1.8 lit/day fluid restriction.   On your next visit with her primary care physician please Get Medicines reviewed and adjusted.  Please request your Prim.MD to go over all Hospital Tests and  Procedure/Radiological results at the follow up, please get all Hospital records sent to your Prim MD by signing hospital release before you go home.   If you experience worsening of your admission symptoms, develop shortness of breath, life threatening emergency, suicidal or homicidal thoughts you must seek medical attention immediately by calling 911 or calling your MD immediately  if symptoms less severe.  You Must read complete instructions/literature along with all the possible adverse reactions/side effects for all the Medicines you take and that have been prescribed to you. Take any new Medicines after you have completely understood and accpet all the possible adverse reactions/side effects.   Do not drive and provide baby sitting services if your were admitted for syncope or siezures until you have seen by Primary MD or a Neurologist and advised to do so again.  Do not drive when taking Pain medications.    Do not take more than prescribed Pain, Sleep and Anxiety Medications  Special Instructions: If you have smoked or chewed Tobacco  in the last 2 yrs please stop smoking, stop any regular Alcohol  and or any Recreational drug use.  Wear Seat belts while driving.   Please note  You were cared for by a hospitalist during your hospital stay. If you have any questions about your discharge medications or the care you received while you were in the hospital after you are discharged, you can call the unit and asked to speak with the hospitalist on call if the hospitalist that took care of you is not available. Once you are discharged, your primary care physician will handle any further medical issues. Please note that NO REFILLS for any discharge medications will be authorized once you are discharged, as it is imperative that you return to your primary care physician (or establish a relationship with a primary care physician if you do not have one) for your aftercare needs so that they can  reassess your need for medications and monitor your lab values.   Increase activity slowly  As directed        Medication List         aspirin 81 MG tablet  Take 81 mg by mouth daily.     atorvastatin 10 MG tablet  Commonly known as:  LIPITOR  Take 10 mg by mouth daily.     dexamethasone 4 MG tablet  Commonly known as:  DECADRON  Take 1 tablet (4 mg total) by mouth daily.  ketorolac 0.4 % Soln  Commonly known as:  ACULAR  1 drop 4 (four) times daily.     levothyroxine 25 MCG tablet  Commonly known as:  SYNTHROID, LEVOTHROID  Take 25 mcg by mouth daily before breakfast.     lisinopril 40 MG tablet  Commonly known as:  PRINIVIL,ZESTRIL  Take 40 mg by mouth daily.     multivitamin tablet  Take 1 tablet by mouth daily.     omeprazole 20 MG capsule  Commonly known as:  PRILOSEC  Take 20 mg by mouth daily.     potassium chloride 10 MEQ tablet  Commonly known as:  KLOR-CON M10  Take 1 tablet (10 mEq total) by mouth 2 (two) times daily.          Diet and Activity recommendation: See Discharge Instructions above   Consults obtained - neurology, discussed with neurology PA Etta Quill personally on 02/08/2014, also discussed curbside with neurosurgeon Dr. Nena Polio.   Major procedures and Radiology Reports - PLEASE review detailed and final reports for all details, in brief -       Dg Chest 2 View  02/05/2014   CLINICAL DATA:  Stroke.  EXAM: CHEST  2 VIEW  COMPARISON:  DG CHEST 2 VIEW dated 09/13/2012  FINDINGS: Mediastinum and hilar structures are normal. Subsegmental atelectasis in the lung bases. Mild developing infiltrate left lung base cannot be excluded. No pleural effusion or pneumothorax. Stable cardiomegaly. Normal pulmonary vascularity. No pneumothorax. Surgical clips left chest.  IMPRESSION: 1. Mild bibasilar atelectasis with possible developing small infiltrate left lung base . 2. Stable cardiomegaly.   Electronically Signed   By: Marcello Moores  Register   On:  02/05/2014 07:43   Dg Shoulder Right  02/04/2014   CLINICAL DATA:  Anterior right shoulder pain following a fall today.  EXAM: RIGHT SHOULDER - 2+ VIEW  COMPARISON:  None.  FINDINGS: Diffuse osteopenia. Old, probable old, healed right humeral shaft fracture. No acute fracture or dislocation seen. Small amount of linear atelectasis or scarring in the right mid to lower lung zone. Thoracic spine degenerative changes and calcified mediastinal lymph nodes.  IMPRESSION: No fracture or dislocation.   Electronically Signed   By: Enrique Sack M.D.   On: 02/04/2014 18:21   Ct Head Wo Contrast  02/06/2014   ADDENDUM REPORT: 02/06/2014 13:13  ADDENDUM: Head CT report 02/04/2014 should read left thalamic mass ,not splenic mass.   Electronically Signed   By: Marcello Moores  Register   On: 02/06/2014 13:13   02/06/2014   CLINICAL DATA:  Injury.  EXAM: CT HEAD WITHOUT CONTRAST  TECHNIQUE: Contiguous axial images were obtained from the base of the skull through the vertex without intravenous contrast.  COMPARISON:  Head CT 03/15/2009.  FINDINGS: A 2.1 x 1.7 cm left alignment mass is present in with surrounding edema and compression of the third ventricle. Minimal midline shift from left to right is present. No hemorrhage. No hydrocephalus. Scalp laceration and hematoma noted right frontal region. No underlying bony abnormality.  IMPRESSION: 1. 2.1 x 1.7 cm left splenic mass with surrounding edema and mild compression of the third ventricle. Mild midline shift from left to right. This could represent a malignancy. MRI the brain with scaling enhancement should be considered. 2. Right frontal scalp laceration and hematoma. No underlying fracture. These results were called by telephone at the time of interpretation on 02/04/2014 at 7:28 PM to Dr. Blanchie Dessert , who verbally acknowledged these results.  Electronically Signed: By: Marcello Moores  Register On:  02/04/2014 19:29   Ct Chest W Contrast  02/07/2014   CLINICAL DATA:  80 year old with  intracranial enhancing lesions most consistent with metastatic disease. Evaluate for primary malignancy or metastases in the chest, abdomen and pelvis.  EXAM: CT CHEST, ABDOMEN, AND PELVIS WITH CONTRAST  TECHNIQUE: Multidetector CT imaging of the chest, abdomen and pelvis was performed following the standard protocol during bolus administration of intravenous contrast.  CONTRAST:  126mL OMNIPAQUE IOHEXOL 300 MG/ML  SOLN  COMPARISON:  Brain MRI 02/06/2014. Abdominal CT 08/08/2011 and 07/30/2008.  FINDINGS: CT CHEST FINDINGS  There are calcified right hilar and subcarinal lymph nodes. No pathologically enlarged mediastinal, hilar or axillary lymph nodes are demonstrated. There is relatively mild atherosclerosis of the aorta, great vessels and coronary arteries.  The thyroid gland is enlarged and heterogeneous in density, although demonstrates no focal mass. The trachea is midline. Patient is status post left mastectomy.  There is no significant pleural or pericardial effusion. Mild atelectasis is present at both lung bases. An 8 mm right middle lobe nodule on image 32 is not significantly changed from the prior CTs. There are additional tiny subpleural ground-glass nodules in both upper lobes. There is no dominant lung mass or endobronchial lesion. There are no worrisome osseous findings.  CT ABDOMEN AND PELVIS FINDINGS  There is stable a mildly complicated cyst within the dome of the right hepatic lobe, measuring 1.8 cm on image 42. No suspicious liver lesions are demonstrated. There are multiple calcified splenic granulomas. Low-density enlargement of both adrenal glands is stable. There are several small partially calcified gallstones. There is no gallbladder wall thickening or biliary dilatation. The pancreas appears normal.  Multiple low density renal lesions are suboptimally characterized based on size but similar to the prior study. No enhancing renal masses are demonstrated. The left kidney is atrophied with  diffuse cortical thinning.  The stomach and small bowel demonstrate no significant findings. There is stool within and diverticulosis of the distal colon, limiting its evaluation. No definite mucosal lesions are observed. There is no evidence of bowel obstruction or abscess.  There is no evidence of pelvic mass status post partial hysterectomy. The bladder appears normal. There are no enlarged abdominal pelvic lymph nodes. Mild aortoiliac atherosclerosis is noted.  There are no worrisome osseous findings.  IMPRESSION: 1. No primary malignancy identified within the chest, abdomen or pelvis. Is there a history of breast cancer? 2. No specific signs of metastatic disease. There are small indeterminate pulmonary nodules bilaterally. Largest nodule in the right middle lobe is unchanged from abdominal CTs performed more than 5 years ago. 3. Grossly stable low density renal lesions bilaterally. Left renal cortical scarring and atrophy.   Electronically Signed   By: Camie Patience M.D.   On: 02/07/2014 18:09   Mr Jeri Cos [id number] Contrast  02/06/2014   CLINICAL DATA:  80 year old female with abnormal head CT recently. Fall with scalp hematoma. Pain. Initial encounter.  EXAM: MRI HEAD WITHOUT AND WITH CONTRAST  TECHNIQUE: Multiplanar, multiecho pulse sequences of the brain and surrounding structures were obtained without and with intravenous contrast.  CONTRAST:  31mL MULTIHANCE GADOBENATE DIMEGLUMINE 529 MG/ML IV SOLN  COMPARISON:  Head CTs without contrast 02/04/2014 and earlier.  FINDINGS: The recent head CT finding corresponds to mass like heterogeneous T2 signal abnormality centered in the left thalamus measuring up to 36 x 24 x 20 (AP by transverse by CC). The signal abnormality appears relatively confined to the thalamus, not definitely tracking into the midbrain or deep  white matter capsules. The area is heterogeneously decreased on precontrast T1 weighted imaging. Post-contrast images demonstrate a nodular 4 mm focus of  enhancement along the anterior superior aspect of the lesion. Small curvilinear enhancement elsewhere in the lesion (e.g. Series 13, image 26) appears to be related to normal vasculature. Diffusion is facilitated within the mass.  There is also a small abnormal focus of enhancement in the posterior right occipital lobe, cortically based and measuring 6 mm diameter. No associated edema.  Furthermore, there is a lobulated extra-axial appearing lesion at the posterior planum sphenoidale best seen on sagittal images (series 4 image 11 versus series 15, image 11) encompassing 16 x 16 x 10 mm). On axial images this does not appear to affect the infundibulum. On sagittal images it appears separate from the pituitary. No definite involvement of the optic chiasm or optic nerves.  Questionable additional small dural based lesion along the left superior frontal convexity (series 13, image 42 and sagittal series 15, image 16). This might be artifact related to a small dural vessel.  No restricted diffusion or evidence of acute infarction. Major intracranial vascular flow voids are preserved. No midline shift, mass effect, ventriculomegaly, extra-axial collection or acute intracranial hemorrhage. Cervicomedullary junction and pituitary are within normal limits. Negative visualized cervical spine. Normal bone marrow signal. Major intracranial vascular flow voids are preserved. Postoperative changes to the globes. Right periorbital scalp hematoma has mildly regressed.  Additional patchy mild for age periventricular white matter nonspecific T2 and FLAIR hyperintensity.  IMPRESSION: 1. Heterogeneous mass like signal abnormality in the left thalamus, overall measures up to 3.6 cm but includes a small 4 mm nodule of enhancement. No significant intracranial mass effect. 2. Subtle abnormal right occipital lobe cortical enhancing lesion measuring 6 mm. 3. Lobulated abnormal enhancing soft tissue at the central planum sphenoidale. A  questionable fourth small 5-6 mm dural based lesion along the left posterior convexity. 4. Constellation of findings is most suggestive of metastatic disease to the brain. Less likely, the lesions in #3 might be inconsequential meningiomas.   Electronically Signed   By: Lars Pinks M.D.   On: 02/06/2014 13:30   Ct Abdomen Pelvis W Contrast  02/07/2014   CLINICAL DATA:  80 year old with intracranial enhancing lesions most consistent with metastatic disease. Evaluate for primary malignancy or metastases in the chest, abdomen and pelvis.  EXAM: CT CHEST, ABDOMEN, AND PELVIS WITH CONTRAST  TECHNIQUE: Multidetector CT imaging of the chest, abdomen and pelvis was performed following the standard protocol during bolus administration of intravenous contrast.  CONTRAST:  166mL OMNIPAQUE IOHEXOL 300 MG/ML  SOLN  COMPARISON:  Brain MRI 02/06/2014. Abdominal CT 08/08/2011 and 07/30/2008.  FINDINGS: CT CHEST FINDINGS  There are calcified right hilar and subcarinal lymph nodes. No pathologically enlarged mediastinal, hilar or axillary lymph nodes are demonstrated. There is relatively mild atherosclerosis of the aorta, great vessels and coronary arteries.  The thyroid gland is enlarged and heterogeneous in density, although demonstrates no focal mass. The trachea is midline. Patient is status post left mastectomy.  There is no significant pleural or pericardial effusion. Mild atelectasis is present at both lung bases. An 8 mm right middle lobe nodule on image 32 is not significantly changed from the prior CTs. There are additional tiny subpleural ground-glass nodules in both upper lobes. There is no dominant lung mass or endobronchial lesion. There are no worrisome osseous findings.  CT ABDOMEN AND PELVIS FINDINGS  There is stable a mildly complicated cyst within the dome of the  right hepatic lobe, measuring 1.8 cm on image 42. No suspicious liver lesions are demonstrated. There are multiple calcified splenic granulomas.  Low-density enlargement of both adrenal glands is stable. There are several small partially calcified gallstones. There is no gallbladder wall thickening or biliary dilatation. The pancreas appears normal.  Multiple low density renal lesions are suboptimally characterized based on size but similar to the prior study. No enhancing renal masses are demonstrated. The left kidney is atrophied with diffuse cortical thinning.  The stomach and small bowel demonstrate no significant findings. There is stool within and diverticulosis of the distal colon, limiting its evaluation. No definite mucosal lesions are observed. There is no evidence of bowel obstruction or abscess.  There is no evidence of pelvic mass status post partial hysterectomy. The bladder appears normal. There are no enlarged abdominal pelvic lymph nodes. Mild aortoiliac atherosclerosis is noted.  There are no worrisome osseous findings.  IMPRESSION: 1. No primary malignancy identified within the chest, abdomen or pelvis. Is there a history of breast cancer? 2. No specific signs of metastatic disease. There are small indeterminate pulmonary nodules bilaterally. Largest nodule in the right middle lobe is unchanged from abdominal CTs performed more than 5 years ago. 3. Grossly stable low density renal lesions bilaterally. Left renal cortical scarring and atrophy.   Electronically Signed   By: Camie Patience M.D.   On: 02/07/2014 18:09    Micro Results      No results found for this or any previous visit (from the past 240 hour(s)).   History of present illness and  Hospital Course:     Kindly see H&P for history of present illness and admission details, please review complete Labs, Consult reports and Test reports for all details in brief Kerry Wells, is a 80 y.o. female, patient with history of  breast cancer, hypertension, dyslipidemia who came to the ER after sustaining a mechanical fall at home and right frontal head laceration, she underwent  CT scan and MRI of the brain which showed left thalamic mass with some surrounding edema.     For her thalamic mass . She was seen by neurology, I discussed the case with neurology personally today who recommended a short trial of Decadron with outpatient followup with oncology and neurosurgery. She will be placed on a Decadron trial, she will follow outpatient with oncology and neurosurgery, question if her mass is a metastatic lesion from her breast cancer. She underwent CT scan of the chest abdomen pelvis which did not show any suspicious malignancy like changes. Discussed her plan with her daughter bedside who is agreeable for outpatient workup, Leanne not interested in pursuing brain biopsy etc., they want medical treatment at this time.   Right frontal head laceration, right shoulder injury due to of mechanical fall. Stable, follow with PCP in a week.   History of hypertension and dementia. Resume home medications unchanged.   Generalized weakness. Will order PT. OT upon discharge for home        Today   Subjective:   Ersie Kuntz today has no headache,no chest abdominal pain,no new weakness tingling or numbness, feels much better wants to go home today.   Objective:   Blood pressure 147/77, pulse 59, temperature 97.5 F (36.4 C), temperature source Oral, resp. rate 18, height 5\' 5"  (1.651 m), weight 56.8 kg (125 lb 3.5 oz), SpO2 98.00%.   Intake/Output Summary (Last 24 hours) at 02/08/14 1015 Last data filed at 02/08/14 0812  Gross per 24 hour  Intake    720 ml  Output      0 ml  Net    720 ml    Exam Awake Alert,  No new F.N deficits, Normal affect [place].AT,PERRAL Supple Neck,No JVD, No cervical lymphadenopathy appriciated.  Symmetrical Chest wall movement, Good air movement bilaterally, CTAB RRR,No Gallops,Rubs or new Murmurs, No Parasternal Heave +ve B.Sounds, Abd Soft, Non tender, No organomegaly appriciated, No rebound -guarding or rigidity. No Cyanosis,  Clubbing or edema, No new Rash or bruise  Data Review   CBC w Diff: Lab Results  Component Value Date   WBC 3.9* 02/04/2014   WBC 2.7* 02/24/2013   WBC 3.6* 10/26/2009   HGB 12.7 02/04/2014   HGB 13.1 10/26/2009   HCT 38.2 02/04/2014   HCT 40.6 10/26/2009   PLT 156 02/04/2014   PLT 205 10/26/2009   LYMPHOPCT 18 02/04/2014   LYMPHOPCT 33.1 10/26/2009   MONOPCT 14* 02/04/2014   MONOPCT 13.0 10/26/2009   EOSPCT 1 02/04/2014   EOSPCT 4.1 10/26/2009   BASOPCT 0 02/04/2014   BASOPCT 0.3 10/26/2009    CMP: Lab Results  Component Value Date   NA 145 02/07/2014   NA 143 02/24/2013   K 4.3 02/07/2014   CL 108 02/07/2014   CO2 27 02/07/2014   BUN 11 02/07/2014   BUN 10 02/24/2013   CREATININE 0.99 02/07/2014   PROT 5.6* 02/24/2013   PROT 6.3 10/26/2009   ALBUMIN 3.9 10/26/2009   BILITOT 1.3* 02/24/2013   ALKPHOS 61 02/24/2013   AST 35 02/24/2013   ALT 13 02/24/2013  .   Total Time in preparing paper work, data evaluation and todays exam - 35 minutes  Thurnell Lose M.D on 02/08/2014 at 10:15 AM  [facility] Office  [phone]

## 2014-02-09 DIAGNOSIS — G939 Disorder of brain, unspecified: Secondary | ICD-10-CM

## 2014-02-09 DIAGNOSIS — F0390 Unspecified dementia without behavioral disturbance: Secondary | ICD-10-CM

## 2014-02-09 DIAGNOSIS — I10 Essential (primary) hypertension: Secondary | ICD-10-CM

## 2014-02-09 DIAGNOSIS — IMO0001 Reserved for inherently not codable concepts without codable children: Secondary | ICD-10-CM

## 2014-04-05 ENCOUNTER — Other Ambulatory Visit (HOSPITAL_COMMUNITY): Payer: Self-pay | Admitting: Internal Medicine

## 2014-04-26 ENCOUNTER — Encounter: Payer: Self-pay | Admitting: Internal Medicine

## 2014-04-26 ENCOUNTER — Ambulatory Visit (INDEPENDENT_AMBULATORY_CARE_PROVIDER_SITE_OTHER): Payer: Medicare Other | Admitting: Internal Medicine

## 2014-04-26 ENCOUNTER — Ambulatory Visit: Payer: Medicare Other | Admitting: Internal Medicine

## 2014-04-26 VITALS — BP 140/78 | HR 56 | Temp 97.8°F | Wt 117.4 lb

## 2014-04-26 DIAGNOSIS — M25511 Pain in right shoulder: Secondary | ICD-10-CM | POA: Insufficient documentation

## 2014-04-26 DIAGNOSIS — G939 Disorder of brain, unspecified: Secondary | ICD-10-CM

## 2014-04-26 DIAGNOSIS — G9389 Other specified disorders of brain: Secondary | ICD-10-CM

## 2014-04-26 DIAGNOSIS — E039 Hypothyroidism, unspecified: Secondary | ICD-10-CM

## 2014-04-26 DIAGNOSIS — R600 Localized edema: Secondary | ICD-10-CM | POA: Insufficient documentation

## 2014-04-26 DIAGNOSIS — F0390 Unspecified dementia without behavioral disturbance: Secondary | ICD-10-CM

## 2014-04-26 DIAGNOSIS — R609 Edema, unspecified: Secondary | ICD-10-CM

## 2014-04-26 DIAGNOSIS — I6350 Cerebral infarction due to unspecified occlusion or stenosis of unspecified cerebral artery: Secondary | ICD-10-CM

## 2014-04-26 DIAGNOSIS — I10 Essential (primary) hypertension: Secondary | ICD-10-CM

## 2014-04-26 DIAGNOSIS — F03B0 Unspecified dementia, moderate, without behavioral disturbance, psychotic disturbance, mood disturbance, and anxiety: Secondary | ICD-10-CM

## 2014-04-26 DIAGNOSIS — M25519 Pain in unspecified shoulder: Secondary | ICD-10-CM

## 2014-04-26 MED ORDER — LEVOTHYROXINE SODIUM 25 MCG PO TABS: 25.0000 ug | ORAL_TABLET | Freq: Every day | ORAL | Status: DC

## 2014-04-26 MED ORDER — LISINOPRIL 40 MG PO TABS: 40.0000 mg | ORAL_TABLET | Freq: Every day | ORAL | Status: DC

## 2014-04-26 MED ORDER — FUROSEMIDE 20 MG PO TABS
20.0000 mg | ORAL_TABLET | Freq: Every day | ORAL | Status: DC
Start: 2014-04-26 — End: 2014-09-01

## 2014-04-26 MED ORDER — NAPROXEN 500 MG PO TABS: 500.0000 mg | ORAL_TABLET | Freq: Two times a day (BID) | ORAL | Status: DC

## 2014-04-26 NOTE — Progress Notes (Signed)
Failed clock drawing  

## 2014-04-26 NOTE — Progress Notes (Signed)
Patient ID: Kerry Wells, female   DOB: 01-14-1920, 80 y.o.   MRN: [id number]  Chief Complaint  Patient presents with  . Acute Visit    feet swelling x 2 months, and RT shoulder pain after falling 4 months ago  . other    having problems with memory   No Known Allergies  HPI 80 y/o female pt is here for follow up after more than a year with her daughter Kerry Wells. She has history of mild memory impairment, CVA, HTN, GERD, hypothyroidism. She was in the hospital from 02/04/14-02/08/14 post fall with head laceration and has a new thalamic mass diagnosed. She was put on decadron and then f/u appointment with neurology was set up. Patient then refused further workup and has been lost to follow up As per daughter, her memory has declined further. She lives in her house with her daughter and her son there. She needs assistance with her ADLs. Can feed herself and dress herself. No further fall since discharge from hospital She also has been having pain in her right shoulder for 4 months now since her fall. Xray from then reviewed and fracture was ruled out. She has been taking tylenol and aspirin and bengay with heat pack for her pain without much help. She has had increased swelling in her legs for 2 months now.  Review of Systems  Constitutional: Negative for fever, chills, and diaphoresis. has lost weight since last visit HENT: Negative for congestion, hearing loss and sore throat.   Eyes: Negative for blurred vision, double vision and discharge.  Respiratory: Negative for cough, sputum production, shortness of breath and wheezing.   Cardiovascular: Negative for chest pain, palpitations, orthopnea  Gastrointestinal: Negative for heartburn, nausea, vomiting, abdominal pain Genitourinary: Negative for dysuria Musculoskeletal: Negative for back pain, falls  Skin: Negative for itching and rash.  Neurological:  Negative for dizziness, tingling, focal weakness and headaches.  Psychiatric/Behavioral:  Negative for depression   Past Medical History  Diagnosis Date  . Breast cancer   . Hypertension   . Hyperlipidemia   . GERD (gastroesophageal reflux disease)   . Diverticulosis   . Cholelithiasis   . Gout   . Osteoporosis   . Anemia   . Depression   . Hemorrhoids   . Insomnia   . Hypothyroidism   . Hiatal hernia   . Breast cancer 2000  . Chronic constipation   . Unspecified urinary incontinence   . Edema   . Encounter for long-term (current) use of other medications   . Unspecified venous (peripheral) insufficiency   . Nonspecific abnormal results of liver function study   . Gout, unspecified   . Memory loss   . Disorders of bilirubin excretion   . Neutropenia, unspecified   . Anemia, unspecified   . Other specified cardiac dysrhythmias(427.89)   . Hypopotassemia   . Disorder of bone and cartilage, unspecified   . Postmastectomy lymphedema syndrome   . Diverticulitis of colon (without mention of hemorrhage)    Current Outpatient Prescriptions on File Prior to Visit  Medication Sig Dispense Refill  . aspirin 81 MG tablet Take 81 mg by mouth daily.        Marland Kitchen atorvastatin (LIPITOR) 10 MG tablet Take 10 mg by mouth daily.      Marland Kitchen ketorolac (ACULAR) 0.4 % SOLN 1 drop 4 (four) times daily.        . Multiple Vitamin (MULTIVITAMIN) tablet Take 1 tablet by mouth daily.        Marland Kitchen  omeprazole (PRILOSEC) 20 MG capsule Take 20 mg by mouth daily.      . potassium chloride (KLOR-CON M10) 10 MEQ tablet Take 1 tablet (10 mEq total) by mouth 2 (two) times daily.  7 tablet  0   No current facility-administered medications on file prior to visit.   Physical exam BP 140/78  Pulse 56  Temp(Src) 97.8 F (36.6 C) (Oral)  Wt 117 lb 6.4 oz (53.252 kg)  SpO2 99%  General- elderly female in no acute distress Head- atraumatic, normocephalic Eyes- PERRLA, EOMI, no pallor, no icterus Neck- no lymphadenopathy, no thyromegaly, no jugular vein distension, no carotid bruit Cardiovascular- normal  s1,s2, no murmurs Respiratory- bilateral clear to auscultation, no wheeze, no rhonchi, no crackles Abdomen- bowel sounds present, soft, non tender Musculoskeletal- able to move all 4 extremities, limited abduction and external rotation in right shoulder and pain on palpation, good radial pulse Neurological- no focal deficit Psychiatry- alert and oriented to person, place with normal affect  Labs- Lab Results  Component Value Date   WBC 3.9* 02/04/2014   HGB 12.7 02/04/2014   HCT 38.2 02/04/2014   MCV 85.1 02/04/2014   PLT 156 02/04/2014   CMP     Component Value Date/Time   NA 145 02/07/2014 0600   NA 143 02/24/2013 1017   K 4.3 02/07/2014 0600   CL 108 02/07/2014 0600   CO2 27 02/07/2014 0600   GLUCOSE 81 02/07/2014 0600   GLUCOSE 84 02/24/2013 1017   BUN 11 02/07/2014 0600   BUN 10 02/24/2013 1017   CREATININE 0.99 02/07/2014 0600   CALCIUM 8.3* 02/07/2014 0600   PROT 5.6* 02/24/2013 1017   PROT 6.3 10/26/2009 1408   ALBUMIN 3.9 10/26/2009 1408   AST 35 02/24/2013 1017   ALT 13 02/24/2013 1017   ALKPHOS 61 02/24/2013 1017   BILITOT 1.3* 02/24/2013 1017   GFRNONAA 48* 02/07/2014 0600   GFRAA 56* 02/07/2014 0600   Lab Results  Component Value Date   TSH 1.180 02/24/2013   Imaging Dg Chest 2 View  02/05/2014   CLINICAL DATA:  Stroke.  EXAM: CHEST  2 VIEW  COMPARISON:  DG CHEST 2 VIEW dated 09/13/2012  FINDINGS: Mediastinum and hilar structures are normal. Subsegmental atelectasis in the lung bases. Mild developing infiltrate left lung base cannot be excluded. No pleural effusion or pneumothorax. Stable cardiomegaly. Normal pulmonary vascularity. No pneumothorax. Surgical clips left chest.  IMPRESSION: 1. Mild bibasilar atelectasis with possible developing small infiltrate left lung base . 2. Stable cardiomegaly.   Electronically Signed   By: Marcello Moores  Register   On: 02/05/2014 07:43   Dg Shoulder Right  02/04/2014   CLINICAL DATA:  Anterior right shoulder pain following a fall today.  EXAM: RIGHT SHOULDER - 2+  VIEW  COMPARISON:  None.  FINDINGS: Diffuse osteopenia. Old, probable old, healed right humeral shaft fracture. No acute fracture or dislocation seen. Small amount of linear atelectasis or scarring in the right mid to lower lung zone. Thoracic spine degenerative changes and calcified mediastinal lymph nodes.  IMPRESSION: No fracture or dislocation.   Electronically Signed   By: Enrique Sack M.D.   On: 02/04/2014 18:21   Ct Head Wo Contrast  02/06/2014   ADDENDUM REPORT: 02/06/2014 13:13  ADDENDUM: Head CT report 02/04/2014 should read left thalamic mass ,not splenic mass.   Electronically Signed   By: Marcello Moores  Register   On: 02/06/2014 13:13   02/06/2014   CLINICAL DATA:  Injury.  EXAM: CT HEAD WITHOUT  CONTRAST  TECHNIQUE: Contiguous axial images were obtained from the base of the skull through the vertex without intravenous contrast.  COMPARISON:  Head CT 03/15/2009.  FINDINGS: A 2.1 x 1.7 cm left alignment mass is present in with surrounding edema and compression of the third ventricle. Minimal midline shift from left to right is present. No hemorrhage. No hydrocephalus. Scalp laceration and hematoma noted right frontal region. No underlying bony abnormality.  IMPRESSION: 1. 2.1 x 1.7 cm left splenic mass with surrounding edema and mild compression of the third ventricle. Mild midline shift from left to right. This could represent a malignancy. MRI the brain with scaling enhancement should be considered. 2. Right frontal scalp laceration and hematoma. No underlying fracture. These results were called by telephone at the time of interpretation on 02/04/2014 at 7:28 PM to Dr. Blanchie Dessert , who verbally acknowledged these results.  Electronically Signed: ByMarcello Moores  Register On: 02/04/2014 19:29   Ct Chest W Contrast  02/07/2014   CLINICAL DATA:  80 year old with intracranial enhancing lesions most consistent with metastatic disease. Evaluate for primary malignancy or metastases in the chest, abdomen and pelvis.   EXAM: CT CHEST, ABDOMEN, AND PELVIS WITH CONTRAST  TECHNIQUE: Multidetector CT imaging of the chest, abdomen and pelvis was performed following the standard protocol during bolus administration of intravenous contrast.  CONTRAST:  157mL OMNIPAQUE IOHEXOL 300 MG/ML  SOLN  COMPARISON:  Brain MRI 02/06/2014. Abdominal CT 08/08/2011 and 07/30/2008.  FINDINGS: CT CHEST FINDINGS  There are calcified right hilar and subcarinal lymph nodes. No pathologically enlarged mediastinal, hilar or axillary lymph nodes are demonstrated. There is relatively mild atherosclerosis of the aorta, great vessels and coronary arteries.  The thyroid gland is enlarged and heterogeneous in density, although demonstrates no focal mass. The trachea is midline. Patient is status post left mastectomy.  There is no significant pleural or pericardial effusion. Mild atelectasis is present at both lung bases. An 8 mm right middle lobe nodule on image 32 is not significantly changed from the prior CTs. There are additional tiny subpleural ground-glass nodules in both upper lobes. There is no dominant lung mass or endobronchial lesion. There are no worrisome osseous findings.  CT ABDOMEN AND PELVIS FINDINGS  There is stable a mildly complicated cyst within the dome of the right hepatic lobe, measuring 1.8 cm on image 42. No suspicious liver lesions are demonstrated. There are multiple calcified splenic granulomas. Low-density enlargement of both adrenal glands is stable. There are several small partially calcified gallstones. There is no gallbladder wall thickening or biliary dilatation. The pancreas appears normal.  Multiple low density renal lesions are suboptimally characterized based on size but similar to the prior study. No enhancing renal masses are demonstrated. The left kidney is atrophied with diffuse cortical thinning.  The stomach and small bowel demonstrate no significant findings. There is stool within and diverticulosis of the distal  colon, limiting its evaluation. No definite mucosal lesions are observed. There is no evidence of bowel obstruction or abscess.  There is no evidence of pelvic mass status post partial hysterectomy. The bladder appears normal. There are no enlarged abdominal pelvic lymph nodes. Mild aortoiliac atherosclerosis is noted.  There are no worrisome osseous findings.  IMPRESSION: 1. No primary malignancy identified within the chest, abdomen or pelvis. Is there a history of breast cancer? 2. No specific signs of metastatic disease. There are small indeterminate pulmonary nodules bilaterally. Largest nodule in the right middle lobe is unchanged from abdominal CTs performed more than 5  years ago. 3. Grossly stable low density renal lesions bilaterally. Left renal cortical scarring and atrophy.   Electronically Signed   By: Camie Patience M.D.   On: 02/07/2014 18:09   Mr Jeri Cos YC Contrast  02/06/2014   CLINICAL DATA:  80 year old female with abnormal head CT recently. Fall with scalp hematoma. Pain. Initial encounter.  EXAM: MRI HEAD WITHOUT AND WITH CONTRAST  TECHNIQUE: Multiplanar, multiecho pulse sequences of the brain and surrounding structures were obtained without and with intravenous contrast.  CONTRAST:  38mL MULTIHANCE GADOBENATE DIMEGLUMINE 529 MG/ML IV SOLN COMPARISON:  Head CTs without contrast 02/04/2014 and earlier.  FINDINGS: The recent head CT finding corresponds to mass like heterogeneous T2 signal abnormality centered in the left thalamus measuring up to 36 x 24 x 20 (AP by transverse by CC). The signal abnormality appears relatively confined to the thalamus, not definitely tracking into the midbrain or deep white matter capsules. The area is heterogeneously decreased on precontrast T1 weighted imaging. Post-contrast images demonstrate a nodular 4 mm focus of enhancement along the anterior superior aspect of the lesion. Small curvilinear enhancement elsewhere in the lesion (e.g. Series 13, image 26) appears  to be related to normal vasculature. Diffusion is facilitated within the mass.  There is also a small abnormal focus of enhancement in the posterior right occipital lobe, cortically based and measuring 6 mm diameter. No associated edema.  Furthermore, there is a lobulated extra-axial appearing lesion at the posterior planum sphenoidale best seen on sagittal images (series 4 image 11 versus series 15, image 11) encompassing 16 x 16 x 10 mm). On axial images this does not appear to affect the infundibulum. On sagittal images it appears separate from the pituitary. No definite involvement of the optic chiasm or optic nerves.  Questionable additional small dural based lesion along the left superior frontal convexity (series 13, image 42 and sagittal series 15, image 16). This might be artifact related to a small dural vessel.  No restricted diffusion or evidence of acute infarction. Major intracranial vascular flow voids are preserved. No midline shift, mass effect, ventriculomegaly, extra-axial collection or acute intracranial hemorrhage. Cervicomedullary junction and pituitary are within normal limits. Negative visualized cervical spine. Normal bone marrow signal. Major intracranial vascular flow voids are preserved. Postoperative changes to the globes. Right periorbital scalp hematoma has mildly regressed.  Additional patchy mild for age periventricular white matter nonspecific T2 and FLAIR hyperintensity.  IMPRESSION: 1. Heterogeneous mass like signal abnormality in the left thalamus, overall measures up to 3.6 cm but includes a small 4 mm nodule of enhancement. No significant intracranial mass effect. 2. Subtle abnormal right occipital lobe cortical enhancing lesion measuring 6 mm. 3. Lobulated abnormal enhancing soft tissue at the central planum sphenoidale. A questionable fourth small 5-6 mm dural based lesion along the left posterior convexity. 4. Constellation of findings is most suggestive of metastatic  disease to the brain. Less likely, the lesions in #3 might be inconsequential meningiomas.   Electronically Signed   By: Lars Pinks M.D.   On: 02/06/2014 13:30   Ct Abdomen Pelvis W Contrast  02/07/2014   CLINICAL DATA:  80 year old with intracranial enhancing lesions most consistent with metastatic disease. Evaluate for primary malignancy or metastases in the chest, abdomen and pelvis.  EXAM: CT CHEST, ABDOMEN, AND PELVIS WITH CONTRAST  TECHNIQUE: Multidetector CT imaging of the chest, abdomen and pelvis was performed following the standard protocol during bolus administration of intravenous contrast.  CONTRAST:  182mL OMNIPAQUE IOHEXOL 300 MG/ML  SOLN  COMPARISON:  Brain MRI 02/06/2014. Abdominal CT 08/08/2011 and 07/30/2008.  FINDINGS: CT CHEST FINDINGS  There are calcified right hilar and subcarinal lymph nodes. No pathologically enlarged mediastinal, hilar or axillary lymph nodes are demonstrated. There is relatively mild atherosclerosis of the aorta, great vessels and coronary arteries.  The thyroid gland is enlarged and heterogeneous in density, although demonstrates no focal mass. The trachea is midline. Patient is status post left mastectomy.  There is no significant pleural or pericardial effusion. Mild atelectasis is present at both lung bases. An 8 mm right middle lobe nodule on image 32 is not significantly changed from the prior CTs. There are additional tiny subpleural ground-glass nodules in both upper lobes. There is no dominant lung mass or endobronchial lesion. There are no worrisome osseous findings.  CT ABDOMEN AND PELVIS FINDINGS  There is stable a mildly complicated cyst within the dome of the right hepatic lobe, measuring 1.8 cm on image 42. No suspicious liver lesions are demonstrated. There are multiple calcified splenic granulomas. Low-density enlargement of both adrenal glands is stable. There are several small partially calcified gallstones. There is no gallbladder wall thickening or  biliary dilatation. The pancreas appears normal.  Multiple low density renal lesions are suboptimally characterized based on size but similar to the prior study. No enhancing renal masses are demonstrated. The left kidney is atrophied with diffuse cortical thinning.  The stomach and small bowel demonstrate no significant findings. There is stool within and diverticulosis of the distal colon, limiting its evaluation. No definite mucosal lesions are observed. There is no evidence of bowel obstruction or abscess.  There is no evidence of pelvic mass status post partial hysterectomy. The bladder appears normal. There are no enlarged abdominal pelvic lymph nodes. Mild aortoiliac atherosclerosis is noted.  There are no worrisome osseous findings.  IMPRESSION: 1. No primary malignancy identified within the chest, abdomen or pelvis. Is there a history of breast cancer? 2. No specific signs of metastatic disease. There are small indeterminate pulmonary nodules bilaterally. Largest nodule in the right middle lobe is unchanged from abdominal CTs performed more than 5 years ago. 3. Grossly stable low density renal lesions bilaterally. Left renal cortical scarring and atrophy.   Electronically Signed   By: Camie Patience M.D.   On: 02/07/2014 18:09   Assessment/plan  1. Moderate dementia without behavioral disturbance Worsening of her memory, likely a mix of vascular, senile and from the brain mass with edema. Family would like to follow up on the mass further at present to see if medical management is still an option. Will provide neurology refrerral for this and dementia rx - Ambulatory referral to Neurology  2. Bilateral edema of lower extremity Normal cardiac and abdominal exam. Concern for protein losing condiiton with her poor po intake. Check prealbumin and have her on lasix 20 mg daily for now. Advised to keep legs elevated at rest - Prealbumin  3. Unspecified hypothyroidism Continue levothyroxine and check  tsh - TSH  4. Essential hypertension, benign Stable, continue lisinopril  5. Right shoulder pain Will have her on naproxen 500 mg bid for a week with ice pack and get shoulder xray - DG Shoulder Right; Future  6. Brain mass Neurology referral to assess her further on treatment plan

## 2014-04-27 ENCOUNTER — Ambulatory Visit
Admission: RE | Admit: 2014-04-27 | Discharge: 2014-04-27 | Disposition: A | Discharge status: Home / Self Care | Payer: Medicare Other | Source: Ambulatory Visit | Attending: Internal Medicine | Admitting: Internal Medicine

## 2014-04-27 DIAGNOSIS — M25511 Pain in right shoulder: Secondary | ICD-10-CM

## 2014-04-27 LAB — PREALBUMIN: Prealbumin: 15 mg/dL — ABNORMAL LOW (ref 20–40)

## 2014-04-27 LAB — TSH: TSH: 0.032 u[IU]/mL — ABNORMAL LOW (ref 0.450–4.500)

## 2014-05-03 ENCOUNTER — Other Ambulatory Visit: Payer: Self-pay | Admitting: Internal Medicine

## 2014-05-03 ENCOUNTER — Other Ambulatory Visit: Payer: Self-pay | Admitting: *Deleted

## 2014-05-03 MED ORDER — LEVOTHYROXINE SODIUM 25 MCG PO TABS: ORAL_TABLET | ORAL | Status: DC

## 2014-05-08 ENCOUNTER — Encounter: Payer: Self-pay | Admitting: Neurology

## 2014-05-08 ENCOUNTER — Ambulatory Visit (INDEPENDENT_AMBULATORY_CARE_PROVIDER_SITE_OTHER): Payer: Medicare Other | Admitting: Neurology

## 2014-05-08 VITALS — BP 136/65 | HR 66 | Temp 97.0°F | Ht 65.0 in | Wt 112.0 lb

## 2014-05-08 DIAGNOSIS — F09 Unspecified mental disorder due to known physiological condition: Secondary | ICD-10-CM

## 2014-05-08 DIAGNOSIS — R4189 Other symptoms and signs involving cognitive functions and awareness: Secondary | ICD-10-CM

## 2014-05-08 DIAGNOSIS — I6350 Cerebral infarction due to unspecified occlusion or stenosis of unspecified cerebral artery: Secondary | ICD-10-CM

## 2014-05-08 MED ORDER — DONEPEZIL HCL 10 MG PO TABS: 10.0000 mg | ORAL_TABLET | Freq: Every day | ORAL | Status: DC

## 2014-05-08 NOTE — Progress Notes (Signed)
[facility]    Provider:  Dr Janann Colonel Referring Provider: Blanchie Serve, MD Primary Care Physician:  Blanchie Serve, MD  CC:  Cognitive decline and brain mass  HPI:  Kerry Wells is a 80 y.o. female here as a referral from Dr. Bubba Camp for cognitive decline evaluation  Initial evaluation for cognitive decline and brain mass. Recently hospitalized for gait instability. Was instructed to see neurosurgery and oncology upon discharge in May. She did not wish to follow up at that time so they did not go through with the appointment. Daughter reports that the PCP requested the patient see neurology for further workup.  The etiology of the mass is unclear at this point.  Daughter notes cognitive decline began around 6 months ago, is mainly a difficulty with short term memory. Lives with family, requires minimal assistance with ADLs. Is not currently driving. Had hallucinations during her hospital stay but otherwise no hallucinations. No agitation, no aggressive behavior. No further falls though gait is unsteady. No focal motor or sensory changes.  No smoking or EtOH use. Patient has history of breast CA, had left mastectomy.   In patient neurology consult 02/2014: Kerry Wells is a 80 y.o. female with a history of mild dementia as well as several days of increased unsteadiness. She had a fall this evening and therefore was brought to the emergency room. In the emergency room, she had a CT of her head which shows a thalamic hypodensity in the left. There is question of mass versus infarct.  MRI brain 02/2014: 1. Heterogeneous mass like signal abnormality in the left thalamus,  overall measures up to 3.6 cm but includes a small 4 mm nodule of  enhancement. No significant intracranial mass effect.  2. Subtle abnormal right occipital lobe cortical enhancing lesion  measuring 6 mm.  3. Lobulated abnormal enhancing soft tissue at the central planum  sphenoidale. A questionable  fourth small 5-6 mm dural based lesion  along the left posterior convexity.  4. Constellation of findings is most suggestive of metastatic  disease to the brain. Less likely, the lesions in #3 might be  inconsequential meningiomas.    Review of Systems: Out of a complete 14 system review, the patient complains of only the following symptoms, and all other reviewed systems are negative. + weight loss, memory loss, confusion, cough, swelling in legs, feeling cold, cough  History   Social History  . Marital Status: Widowed    Spouse Name: N/A    Number of Children: N/A  . Years of Education: N/A   Occupational History  . Not on file.   Social History Main Topics  . Smoking status: Never Smoker   . Smokeless tobacco: Not on file  . Alcohol Use: No  . Drug Use: No  . Sexual Activity: Not on file   Other Topics Concern  . Not on file   Social History Narrative  . No narrative on file    Family History  Problem Relation Age of Onset  . Lung cancer Brother     2  . Colon cancer Sister     2 sisters dx at age 48 and 12  . Heart disease Mother   . Kidney disease Brother   . Heart disease Father   . Heart disease Sister   . Cirrhosis Brother     Past Medical History  Diagnosis Date  . Breast cancer   . Hypertension   . Hyperlipidemia   . GERD (gastroesophageal reflux disease)   .  Diverticulosis   . Cholelithiasis   . Gout   . Osteoporosis   . Anemia   . Depression   . Hemorrhoids   . Insomnia   . Hypothyroidism   . Hiatal hernia   . Breast cancer 2000  . Chronic constipation   . Unspecified urinary incontinence   . Edema   . Encounter for long-term (current) use of other medications   . Unspecified venous (peripheral) insufficiency   . Nonspecific abnormal results of liver function study   . Gout, unspecified   . Memory loss   . Disorders of bilirubin excretion   . Neutropenia, unspecified   . Anemia, unspecified   . Other specified cardiac  dysrhythmias(427.89)   . Hypopotassemia   . Disorder of bone and cartilage, unspecified   . Postmastectomy lymphedema syndrome   . Diverticulitis of colon (without mention of hemorrhage)     Past Surgical History  Procedure Laterality Date  . Mastectomy  2008    left  . Vaginal hysterectomy    . Fibroid tumor removal    . Abdominal hysterectomy      Current Outpatient Prescriptions  Medication Sig Dispense Refill  . aspirin 81 MG tablet Take 81 mg by mouth daily.        Marland Kitchen atorvastatin (LIPITOR) 10 MG tablet Take 10 mg by mouth daily.      . furosemide (LASIX) 20 MG tablet Take 1 tablet (20 mg total) by mouth daily.  30 tablet  3  . ketorolac (ACULAR) 0.4 % SOLN 1 drop 4 (four) times daily.        Marland Kitchen levothyroxine (SYNTHROID, LEVOTHROID) 25 MCG tablet Take 1/2 tablet by mouth daily before breakfast.  15 tablet  2  . lisinopril (PRINIVIL,ZESTRIL) 40 MG tablet Take 1 tablet (40 mg total) by mouth daily.  30 tablet  3  . Multiple Vitamin (MULTIVITAMIN) tablet Take 1 tablet by mouth daily.        . naproxen (NAPROSYN) 500 MG tablet Take 1 tablet (500 mg total) by mouth 2 (two) times daily with a meal.  30 tablet  0  . omeprazole (PRILOSEC) 20 MG capsule Take 20 mg by mouth daily.      Marland Kitchen omeprazole (PRILOSEC) 20 MG capsule TAKE ONE CAPSULE BY MOUTH ONCE DAILY TO REDUCE STOMACH ACID AND HELP APPETITE  30 capsule  1  . potassium chloride (KLOR-CON M10) 10 MEQ tablet Take 1 tablet (10 mEq total) by mouth 2 (two) times daily.  7 tablet  0   No current facility-administered medications for this visit.    Allergies as of 05/08/2014  . (No Known Allergies)    Vitals: BP 136/65  Pulse 66  Temp(Src) 97 F (36.1 C) (Oral)  Ht 5\' 5"  (1.651 m)  Wt 112 lb (50.803 kg)  BMI 18.64 kg/m2 Last Weight:  Wt Readings from Last 1 Encounters:  05/08/14 112 lb (50.803 kg)   Last Height:   Ht Readings from Last 1 Encounters:  05/08/14 5\' 5"  (1.651 m)     Physical exam: Exam: Gen: NAD,  conversant Eyes: anicteric sclerae, moist conjunctivae HENT: Atraumatic, oropharynx clear Neck: Trachea midline; supple,  Lungs: CTA, no wheezing, rales, rhonic                          CV: RRR, no MRG Abdomen: Soft, non-tender;  Extremities: No peripheral edema  Skin: Normal temperature, no rash,  Psych: Appropriate affect, pleasant  Neuro: Mental Status:  [facility] 9/30  Cranial Nerves:  II: Visual fields grossly normal, pupils equal, round, reactive to light and accommodation  III,IV, VI: ptosis not present, extra-ocular motions intact bilaterally  V,VII: smile symmetric, facial light touch sensation normal bilaterally  VIII: hearing normal bilaterally  IX,X: gag reflex present  XI: bilateral shoulder shrug  XII: midline tongue extension without atrophy or fasciculations  Motor:  5/5 througout  Sensory: Pinprick and light touch intact throughout, bilaterally  Deep Tendon Reflexes:  2+ throughout UE and 1+ in LE  Plantars:  Right: downgoing Left: downgoing    Assessment:  After physical and neurologic examination, review of laboratory studies, imaging, neurophysiology testing and pre-existing records, assessment will be reviewed on the problem list.  Plan:  Treatment plan and additional workup will be reviewed under Problem List.  1)Cogntiive decline 2)Brain mass  80y/o woman presenting for initial evaluation of cognitive decline and a brain mass of unclear etiology though suspicious for metastatic disease. Patient has not followed up with neurosurgery or oncology suince hospital discharge. Had long discussion with patients daughter about the underlying brain mass. Counseled her that the patient should follow up with oncology and neurosurgery for further evaluation and discuss of prognosis/treatment options. Patient and daughter adamantly stated that they do not want any intervention at this time in regards to the mass. Daughter was counseled on risks of this and expressed  understanding. Will check B12, MMA, TSH. Will start patient on Aricept 5mg  with goal to titrate up to 10mg  nightly. Patient will follow up with NP or Dr Jaynee Eagles in 4 months.   Jim Like, DO  [facility] [address] [place], [place] [address]  Phone [phone] Fax [phone]

## 2014-05-08 NOTE — Patient Instructions (Signed)
Overall you are doing fairly well but I do want to suggest a few things today:   Remember to drink plenty of fluid, eat healthy meals and do not skip any meals. Try to eat protein with a every meal and eat a healthy snack such as fruit or nuts in between meals. Try to keep a regular sleep-wake schedule and try to exercise daily, particularly in the form of walking, 20-30 minutes a day, if you can.   As far as your medications are concerned, I would like to suggest we try starting Aricept. Please take 1/2 (5mg ) tablet once nightly for 14 days and then increase to 1 whole tablet (10mg ) nightly.  Please have some blood work checked today  Follow up in 4 months. Please call us with any interim questions, concerns, problems, updates or refill requests.   My clinical assistant and will answer any of your questions and relay your messages to me and also relay most of my messages to you.   Our phone number is [phone]. We also have an after hours call service for urgent matters and there is a physician on-call for urgent questions. For any emergencies you know to call 911 or go to the nearest emergency room

## 2014-05-09 DIAGNOSIS — Z0289 Encounter for other administrative examinations: Secondary | ICD-10-CM

## 2014-05-10 LAB — VITAMIN B12: VITAMIN B 12: 425 pg/mL (ref 211–946)

## 2014-05-10 LAB — METHYLMALONIC ACID, SERUM: Methylmalonic Acid: 238 nmol/L (ref 0–378)

## 2014-05-10 LAB — TSH: TSH: 0.03 u[IU]/mL — ABNORMAL LOW (ref 0.450–4.500)

## 2014-05-22 ENCOUNTER — Telehealth: Payer: Self-pay | Admitting: Neurology

## 2014-05-22 NOTE — Telephone Encounter (Signed)
Left message with patient's daughter? To verify how son, Kerry Wells wished to take his FMLA in caring for his mother.  To complete the form we need to know if its intermittent as needed or on a continuous basis.  Asked that he return call.

## 2014-05-23 ENCOUNTER — Telehealth: Payer: Self-pay | Admitting: *Deleted

## 2014-05-23 NOTE — Telephone Encounter (Signed)
Form at front desk.05/23/14

## 2014-05-23 NOTE — Telephone Encounter (Signed)
Spoke to patient's son and he wanted a continuous leave.  The FMLA is completed and sent to MR.

## 2014-05-31 ENCOUNTER — Other Ambulatory Visit: Payer: Self-pay | Admitting: Internal Medicine

## 2014-06-07 ENCOUNTER — Ambulatory Visit: Payer: Self-pay | Admitting: Internal Medicine

## 2014-06-20 ENCOUNTER — Ambulatory Visit: Payer: Self-pay | Admitting: Internal Medicine

## 2014-06-20 ENCOUNTER — Encounter: Payer: Self-pay | Admitting: Internal Medicine

## 2014-06-20 DIAGNOSIS — Z0289 Encounter for other administrative examinations: Secondary | ICD-10-CM

## 2014-06-29 ENCOUNTER — Other Ambulatory Visit: Payer: Self-pay | Admitting: Internal Medicine

## 2014-08-05 ENCOUNTER — Other Ambulatory Visit: Payer: Self-pay | Admitting: Internal Medicine

## 2014-08-05 ENCOUNTER — Other Ambulatory Visit: Payer: Self-pay | Admitting: Nurse Practitioner

## 2014-09-01 ENCOUNTER — Other Ambulatory Visit: Payer: Self-pay | Admitting: Internal Medicine

## 2014-09-06 ENCOUNTER — Encounter: Payer: Self-pay | Admitting: Neurology

## 2014-09-06 ENCOUNTER — Ambulatory Visit (INDEPENDENT_AMBULATORY_CARE_PROVIDER_SITE_OTHER): Payer: Medicare Other | Admitting: Neurology

## 2014-09-06 VITALS — BP 145/77 | HR 74 | Ht 65.0 in | Wt 119.0 lb

## 2014-09-06 DIAGNOSIS — I639 Cerebral infarction, unspecified: Secondary | ICD-10-CM

## 2014-09-06 DIAGNOSIS — F4489 Other dissociative and conversion disorders: Secondary | ICD-10-CM

## 2014-09-06 MED ORDER — DONEPEZIL HCL 10 MG PO TABS: 10.0000 mg | ORAL_TABLET | Freq: Every day | ORAL | Status: DC

## 2014-09-06 NOTE — Progress Notes (Deleted)
CC: Cognitive decline and brain mass  HPI: Kerry Wells is a 80 y.o. female here as a referral from Dr. Bubba Camp for cognitive decline evaluation. Daughter gives all the information.   Has confusion more in the early evenings. She wakes up at night and she gets up and thinks someone has told her to do something, she thought she was cooking. That there was fish in the oven. Sometimes thinks she needs to leave the house. She has episodes of confusion and she will talk about something and later not have any recollection. She will sometimes see her dead sister, thinks she was just talking to her. Usually at night or in the morning. Still goes to church and to meetings. No tremors. No shuffling, but does walk slow. No stiffness. No problems turning. No freezes. She is still walking, uses a walker reluctantly. No recent falls. Doesn't eat as much, losing some weight. Decreased appetite. Still dresses herself, showers, toilets, transfers. Lives with son and daughter.   Initial evaluation for cognitive decline and brain mass. Recently hospitalized for gait instability. Was instructed to see neurosurgery and oncology upon discharge in May. She did not wish to follow up at that time so they did not go through with the appointment. Daughter reports that the PCP requested the patient see neurology for further workup. The etiology of the mass is unclear at this point.  Daughter notes cognitive decline began around 6 months ago, is mainly a difficulty with short term memory. Lives with family, requires minimal assistance with ADLs. Is not currently driving. Had hallucinations during her hospital stay but otherwise no hallucinations. No agitation, no aggressive behavior. No further falls though gait is unsteady. No focal motor or sensory changes.  No smoking or EtOH use. Patient has history of breast CA, had left mastectomy.   In patient neurology consult 02/2014: [name] is a 80 y.o. female with a  history of mild dementia as well as several days of increased unsteadiness. She had a fall this evening and therefore was brought to the emergency room. In the emergency room, she had a CT of her head which shows a thalamic hypodensity in the left. There is question of mass versus infarct.  MRI brain 02/2014: 1. Heterogeneous mass like signal abnormality in the left thalamus,  overall measures up to 3.6 cm but includes a small 4 mm nodule of  enhancement. No significant intracranial mass effect.  2. Subtle abnormal right occipital lobe cortical enhancing lesion  measuring 6 mm.  3. Lobulated abnormal enhancing soft tissue at the central planum  sphenoidale. A questionable fourth small 5-6 mm dural based lesion  along the left posterior convexity.  4. Constellation of findings is most suggestive of metastatic  disease to the brain. Less likely, the lesions in #3 might be  inconsequential meningiomas.

## 2014-09-06 NOTE — Patient Instructions (Signed)
Overall you are doing fairly well but I do want to suggest a few things today:   Remember to drink plenty of fluid, eat healthy meals and do not skip any meals. Try to eat protein with a every meal and eat a healthy snack such as fruit or nuts in between meals. Try to keep a regular sleep-wake schedule and try to exercise daily, particularly in the form of walking, 20-30 minutes a day, if you can.   As far as your medications are concerned, I would like to suggest: Aricept 10mg  daily. In 2 weeks if everything is fine, we can start Namenda(Memantine) as well. Please call.   As far as diagnostic testing: EEG  I would like to see you back in 3 months, sooner if we need to. Please call us with any interim questions, concerns, problems, updates or refill requests.   Please also call us for any test results so we can go over those with you on the phone.  My clinical assistant and will answer any of your questions and relay your messages to me and also relay most of my messages to you.   Our phone number is [phone]. We also have an after hours call service for urgent matters and there is a physician on-call for urgent questions. For any emergencies you know to call 911 or go to the nearest emergency room

## 2014-09-07 ENCOUNTER — Ambulatory Visit (INDEPENDENT_AMBULATORY_CARE_PROVIDER_SITE_OTHER): Payer: Medicare Other | Admitting: Neurology

## 2014-09-07 ENCOUNTER — Encounter: Payer: Self-pay | Admitting: Neurology

## 2014-09-07 DIAGNOSIS — F4489 Other dissociative and conversion disorders: Secondary | ICD-10-CM

## 2014-09-07 NOTE — Progress Notes (Signed)
[facility]    Provider:  Dr Jaynee Eagles Referring Provider: Blanchie Serve, MD Primary Care Physician:  Blanchie Serve, MD  CC:  Memory loss and brain lesion  HPI:  Kerry Wells is a 80 y.o. female here as a follow up   CC: Cognitive decline and brain mass  HPI: Kerry Wells is a 80 y.o. female here as a follow up from Dr. Bubba Camp for cognitive decline of about 9 months and a brain mass of unknown etiology(they have declined intervention). Daughter gives all the information. She is a former patient of Dr. Janann Colonel patient and she is transitioning care to me.   Has confusion more in the early evenings, overnight or mornings. She wakes up at night and she gets up and thinks someone has told her to do something and walks around confused, she thought she was cooking and that there was fish in the oven one night. Sometimes thinks she needs to leave the house and they have had to watch her closely. She has episodes where she will "talk nonsense" and later not have any recollection. She will sometimes see her dead sister, thinks she was just talking to her. Usually at night or in the morning.   Still goes to church and to meetings and likes being social. No tremors. No shuffling, but does walk slow. No stiffness. No problems turning. No freezes. She is still walking, uses a walker reluctantly. No recent falls. Doesn't eat as much, losing some weight. Decreased appetite. Still dresses herself, showers, toilets, transfers. Lives with son and daughter.   Initial evaluation by Dr. Janann Colonel: Here for cognitive decline and brain mass. Recently hospitalized for gait instability. Was instructed to see neurosurgery and oncology upon discharge in May. She did not wish to follow up at that time so they did not go through with the appointment. Daughter reports that the PCP requested the patient see neurology for further workup. The etiology of the mass is unclear at this point.  Daughter  notes cognitive decline began around 6 months ago, is mainly a difficulty with short term memory. Lives with family, requires minimal assistance with ADLs. Is not currently driving. Had hallucinations during her hospital stay but otherwise no hallucinations. No agitation, no aggressive behavior. No further falls though gait is unsteady. No focal motor or sensory changes.  No smoking or EtOH use. Patient has history of breast CA, had left mastectomy.   In patient neurology consult 02/2014: Kerry Wells is a 80 y.o. female with a history of mild dementia as well as several days of increased unsteadiness. She had a fall this evening and therefore was brought to the emergency room. In the emergency room, she had a CT of her head which shows a thalamic hypodensity in the left. There is question of mass versus infarct.  MRI brain 02/2014: 1. Heterogeneous mass like signal abnormality in the left thalamus,  overall measures up to 3.6 cm but includes a small 4 mm nodule of  enhancement. No significant intracranial mass effect.  2. Subtle abnormal right occipital lobe cortical enhancing lesion  measuring 6 mm.  3. Lobulated abnormal enhancing soft tissue at the central planum  sphenoidale. A questionable fourth small 5-6 mm dural based lesion  along the left posterior convexity.  4. Constellation of findings is most suggestive of metastatic  disease to the brain. Less likely, the lesions in #3 might be  inconsequential meningiomas.  Personally reviewed MRi images with patient and daughter   Review  of Systems: Patient complains of symptoms per HPI as well as the following symptoms No CP, No SOB. Pertinent negatives per HPI. All others negative.   History   Social History  . Marital Status: Widowed    Spouse Name: N/A    Number of Children: 6  . Years of Education: N/A   Occupational History  . Not on file.   Social History Main Topics  . Smoking status: Never Smoker   .  Smokeless tobacco: Never Used  . Alcohol Use: No  . Drug Use: No  . Sexual Activity: Not on file   Other Topics Concern  . Not on file   Social History Narrative   Patient lives at home with daughter and son.    Patient has 6 children   Patient is widowed    Patient has a high school education    Patient is retired        Family History  Problem Relation Age of Onset  . Lung cancer Brother     2  . Colon cancer Sister     2 sisters dx at age 79 and 97  . Heart disease Mother   . Kidney disease Brother   . Heart disease Father   . Heart disease Sister   . Cirrhosis Brother     Past Medical History  Diagnosis Date  . Breast cancer   . Hypertension   . Hyperlipidemia   . GERD (gastroesophageal reflux disease)   . Diverticulosis   . Cholelithiasis   . Gout   . Osteoporosis   . Anemia   . Depression   . Hemorrhoids   . Insomnia   . Hypothyroidism   . Hiatal hernia   . Breast cancer 2000  . Chronic constipation   . Unspecified urinary incontinence   . Edema   . Encounter for long-term (current) use of other medications   . Unspecified venous (peripheral) insufficiency   . Nonspecific abnormal results of liver function study   . Gout, unspecified   . Memory loss   . Disorders of bilirubin excretion   . Neutropenia, unspecified   . Anemia, unspecified   . Other specified cardiac dysrhythmias(427.89)   . Hypopotassemia   . Disorder of bone and cartilage, unspecified   . Postmastectomy lymphedema syndrome   . Diverticulitis of colon (without mention of hemorrhage)     Past Surgical History  Procedure Laterality Date  . Mastectomy  2008    left  . Vaginal hysterectomy    . Fibroid tumor removal    . Abdominal hysterectomy      Current Outpatient Prescriptions  Medication Sig Dispense Refill  . aspirin 81 MG tablet Take 81 mg by mouth daily.      Marland Kitchen atorvastatin (LIPITOR) 10 MG tablet Take 10 mg by mouth daily.    Marland Kitchen donepezil (ARICEPT) 10 MG tablet  Take 1 tablet (10 mg total) by mouth at bedtime. 30 tablet 5  . furosemide (LASIX) 20 MG tablet TAKE ONE TABLET BY MOUTH ONCE DAILY 30 tablet 1  . levothyroxine (SYNTHROID, LEVOTHROID) 25 MCG tablet Take 1/2 tablet by mouth daily before breakfast. 15 tablet 2  . lisinopril (PRINIVIL,ZESTRIL) 40 MG tablet TAKE ONE TABLET BY MOUTH ONCE DAILY 30 tablet 1  . Multiple Vitamin (MULTIVITAMIN) tablet Take 1 tablet by mouth daily.      . naproxen (NAPROSYN) 500 MG tablet TAKE ONE TABLET BY MOUTH TWICE DAILY WITH MEALS 30 tablet 0  . omeprazole (PRILOSEC) 20  MG capsule Take 20 mg by mouth daily.    Marland Kitchen oxybutynin (DITROPAN-XL) 5 MG 24 hr tablet Take 1 tablet by mouth daily.     No current facility-administered medications for this visit.    Allergies as of 09/06/2014  . (No Known Allergies)    Vitals: BP 145/77 mmHg  Pulse 74  Ht 5\' 5"  (1.651 m)  Wt 119 lb (53.978 kg)  BMI 19.80 kg/m2 Last Weight:  Wt Readings from Last 1 Encounters:  09/06/14 119 lb (53.978 kg)   Last Height:   Ht Readings from Last 1 Encounters:  09/06/14 5\' 5"  (1.651 m)   Exam: Gen: NAD, conversant Eyes: anicteric sclerae, moist conjunctivae  CV: RRR, no MRG Extremities: No peripheral edema  Skin: Normal temperature, no rash,  Psych: Appropriate affect, pleasant, non conversant  Neuro: Mental Status: Alert, not conversant, oriented to person but not month or year [facility] 9/30 No aphasia or dysarthria however she doesn't speak much  Cranial Nerves:  Visual fields grossly normal, pupils equal, round, reactive to light, cannot visualize fundi, ptosis not present, extra-ocular motions intact bilaterally, smile symmetric, facial light touch sensation normal bilaterally, hearing normal bilaterally, tongue midline, shoulder shrug intact Coordination: no dysmetria noted Motor:  5/5 throughout, tone is not increased Sensory:  light touch intact throughout Deep Tendon Reflexes:  2+ UE and 1+ in  LE  Plantars:  Right: downgoing Left: downgoing  Gait: mildly show with some narrow stance but not shuffling, arm swing intact   Assessment: 80y/o woman presenting for follow up of cognitive decline and a brain mass of unclear etiology though suspicious for metastatic disease. Patient has not followed up with neurosurgery or oncology. Dr Janann Colonel had long discussion with patient and daughter about the underlying brain mass. Counseled that the patient should follow up with oncology and neurosurgery for further evaluation and discussion of prognosis/treatment options. Per notes, patient and daughter adamantly stated that they do not want any intervention at this time in regards to the mass and were counseled on risks of this and expressed understanding.   Today I discussed the MRI with patient and daughter. They are certain that they do not want any intervention. Both daughter and mother verbally acknowledged understanding of the risks including death.  Patient has confusional episodes. She is having discreet episodes such as "talking nonsense" then not remembering it, and stating her deceased sister is in the room. Given brain mass, will check an EEG for seizure activity.   Patient is tolerating Aricept well, will increase to 10mg  daily for dementia  Sarina Ill, MD  [facility] [address] [place], [place] [address]  Phone [phone] Fax [phone]

## 2014-09-07 NOTE — Procedures (Signed)
   History:  Kerry Wells is a 80 year old patient with a history of a gradual decline in cognitive status and episodes of confusion. 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 The patient is being evaluated for the confusion.  This is a routine EEG. No skull defects are noted. Medications include aspirin, Lipitor, Aricept, Lasix, Synthroid, lisinopril, multivitamins, Naprosyn, Prilosec, and oxybutynin.   EEG classification: Normal awake and asleep  Description of the recording: The background rhythms of this recording consists of a fairly well modulated medium amplitude background activity of 8 Hz. As the record progresses, the patient initially is in the waking state, but appears to enter the early stage II sleep during the recording, with rudimentary sleep spindles and vertex sharp wave activity seen. During the wakeful state, photic stimulation is performed, and this results in a bilateral and symmetric photic driving response. Hyperventilation was not performed. At no time during the recording does there appear to be evidence of spike or spike wave discharges or evidence of focal slowing. EKG monitor shows an occasional irregular heart rhythm with a heart rate of 60.  Impression: This is a normal EEG recording in the waking and sleeping state. No evidence of ictal or interictal discharges were seen at any time during the recording.

## 2014-09-11 ENCOUNTER — Telehealth: Payer: Self-pay | Admitting: *Deleted

## 2014-09-11 NOTE — Telephone Encounter (Signed)
-----   Message from Melvenia Beam, MD sent at 09/07/2014  8:59 PM EST ----- Please let patient know that EEG was normal, no seizures on EEG. Thank you

## 2014-09-11 NOTE — Telephone Encounter (Signed)
Spoke with patient's daughter, informed her that EEG was normal, no seizure activity, no further questions or concerns were expressed.

## 2014-11-04 ENCOUNTER — Other Ambulatory Visit: Payer: Self-pay | Admitting: Internal Medicine

## 2014-11-21 ENCOUNTER — Emergency Department (HOSPITAL_COMMUNITY)
Admission: EM | Admit: 2014-11-21 | Discharge: 2014-11-21 | Disposition: A | Discharge status: Home / Self Care | Payer: Medicare Other | Attending: Emergency Medicine | Admitting: Emergency Medicine

## 2014-11-21 ENCOUNTER — Emergency Department (HOSPITAL_COMMUNITY): Payer: Medicare Other

## 2014-11-21 ENCOUNTER — Encounter (HOSPITAL_COMMUNITY): Payer: Self-pay | Admitting: Emergency Medicine

## 2014-11-21 ENCOUNTER — Emergency Department (INDEPENDENT_AMBULATORY_CARE_PROVIDER_SITE_OTHER)
Admission: EM | Admit: 2014-11-21 | Discharge: 2014-11-21 | Disposition: A | Discharge status: Nursing Home (Includes ICF) | Payer: Medicare Other | Source: Home / Self Care | Attending: Family Medicine | Admitting: Family Medicine

## 2014-11-21 DIAGNOSIS — Z7982 Long term (current) use of aspirin: Secondary | ICD-10-CM | POA: Insufficient documentation

## 2014-11-21 DIAGNOSIS — E785 Hyperlipidemia, unspecified: Secondary | ICD-10-CM | POA: Insufficient documentation

## 2014-11-21 DIAGNOSIS — Z853 Personal history of malignant neoplasm of breast: Secondary | ICD-10-CM | POA: Insufficient documentation

## 2014-11-21 DIAGNOSIS — R1012 Left upper quadrant pain: Secondary | ICD-10-CM | POA: Diagnosis not present

## 2014-11-21 DIAGNOSIS — Z791 Long term (current) use of non-steroidal anti-inflammatories (NSAID): Secondary | ICD-10-CM | POA: Insufficient documentation

## 2014-11-21 DIAGNOSIS — K219 Gastro-esophageal reflux disease without esophagitis: Secondary | ICD-10-CM | POA: Insufficient documentation

## 2014-11-21 DIAGNOSIS — Z9889 Other specified postprocedural states: Secondary | ICD-10-CM | POA: Insufficient documentation

## 2014-11-21 DIAGNOSIS — M109 Gout, unspecified: Secondary | ICD-10-CM | POA: Insufficient documentation

## 2014-11-21 DIAGNOSIS — I10 Essential (primary) hypertension: Secondary | ICD-10-CM | POA: Insufficient documentation

## 2014-11-21 DIAGNOSIS — Z79899 Other long term (current) drug therapy: Secondary | ICD-10-CM | POA: Diagnosis not present

## 2014-11-21 DIAGNOSIS — Z8659 Personal history of other mental and behavioral disorders: Secondary | ICD-10-CM | POA: Insufficient documentation

## 2014-11-21 DIAGNOSIS — K5900 Constipation, unspecified: Secondary | ICD-10-CM | POA: Diagnosis not present

## 2014-11-21 DIAGNOSIS — Z862 Personal history of diseases of the blood and blood-forming organs and certain disorders involving the immune mechanism: Secondary | ICD-10-CM | POA: Diagnosis not present

## 2014-11-21 DIAGNOSIS — Z90710 Acquired absence of both cervix and uterus: Secondary | ICD-10-CM | POA: Diagnosis not present

## 2014-11-21 DIAGNOSIS — R1032 Left lower quadrant pain: Secondary | ICD-10-CM

## 2014-11-21 DIAGNOSIS — E039 Hypothyroidism, unspecified: Secondary | ICD-10-CM | POA: Insufficient documentation

## 2014-11-21 DIAGNOSIS — Z8669 Personal history of other diseases of the nervous system and sense organs: Secondary | ICD-10-CM | POA: Diagnosis not present

## 2014-11-21 LAB — CBC WITH DIFFERENTIAL/PLATELET
Basophils Absolute: 0 10*3/uL (ref 0.0–0.1)
Basophils Relative: 0 % (ref 0–1)
EOS ABS: 0 10*3/uL (ref 0.0–0.7)
Eosinophils Relative: 1 % (ref 0–5)
HEMATOCRIT: 40.4 % (ref 36.0–46.0)
Hemoglobin: 13.2 g/dL (ref 12.0–15.0)
Lymphocytes Relative: 26 % (ref 12–46)
Lymphs Abs: 0.8 10*3/uL (ref 0.7–4.0)
MCH: 28 pg (ref 26.0–34.0)
MCHC: 32.7 g/dL (ref 30.0–36.0)
MCV: 85.6 fL (ref 78.0–100.0)
Monocytes Absolute: 0.2 10*3/uL (ref 0.1–1.0)
Monocytes Relative: 7 % (ref 3–12)
Neutro Abs: 2 10*3/uL (ref 1.7–7.7)
Neutrophils Relative %: 66 % (ref 43–77)
PLATELETS: 200 10*3/uL (ref 150–400)
RBC: 4.72 MIL/uL (ref 3.87–5.11)
RDW: 14.5 % (ref 11.5–15.5)
WBC: 3.1 10*3/uL — ABNORMAL LOW (ref 4.0–10.5)

## 2014-11-21 LAB — POCT URINALYSIS DIP (DEVICE)
GLUCOSE, UA: NEGATIVE mg/dL
LEUKOCYTES UA: NEGATIVE
Nitrite: NEGATIVE
Protein, ur: NEGATIVE mg/dL
Specific Gravity, Urine: 1.025 (ref 1.005–1.030)
Urobilinogen, UA: 1 mg/dL (ref 0.0–1.0)
pH: 6 (ref 5.0–8.0)

## 2014-11-21 LAB — COMPREHENSIVE METABOLIC PANEL
ALT: 27 U/L (ref 0–35)
ANION GAP: 7 (ref 5–15)
AST: 30 U/L (ref 0–37)
Albumin: 3.6 g/dL (ref 3.5–5.2)
Alkaline Phosphatase: 72 U/L (ref 39–117)
BUN: 9 mg/dL (ref 6–23)
CALCIUM: 9.2 mg/dL (ref 8.4–10.5)
CO2: 27 mmol/L (ref 19–32)
Chloride: 109 mmol/L (ref 96–112)
Creatinine, Ser: 1.05 mg/dL (ref 0.50–1.10)
GFR calc Af Amer: 52 mL/min — ABNORMAL LOW (ref >90)
GFR, EST NON AFRICAN AMERICAN: 45 mL/min — AB (ref >90)
Glucose, Bld: 85 mg/dL (ref 70–99)
Potassium: 4.1 mmol/L (ref 3.5–5.1)
SODIUM: 143 mmol/L (ref 135–145)
Total Bilirubin: 1.4 mg/dL — ABNORMAL HIGH (ref 0.3–1.2)
Total Protein: 6.1 g/dL (ref 6.0–8.3)

## 2014-11-21 LAB — LIPASE, BLOOD: LIPASE: 21 U/L (ref 11–59)

## 2014-11-21 MED ORDER — SODIUM CHLORIDE 0.9 % IV BOLUS (SEPSIS)
500.0000 mL | Freq: Once | INTRAVENOUS | Status: AC
  Administered 2014-11-21: 500 mL via INTRAVENOUS

## 2014-11-21 MED ORDER — IOHEXOL 300 MG/ML  SOLN
100.0000 mL | Freq: Once | INTRAMUSCULAR | Status: AC | PRN
  Administered 2014-11-21: 100 mL via INTRAVENOUS

## 2014-11-21 MED ORDER — ACETAMINOPHEN 325 MG PO TABS
650.0000 mg | ORAL_TABLET | Freq: Once | ORAL | Status: AC
  Administered 2014-11-21: 650 mg via ORAL
  Filled 2014-11-21: qty 2

## 2014-11-21 MED ORDER — IOHEXOL 300 MG/ML  SOLN
25.0000 mL | INTRAMUSCULAR | Status: AC
  Administered 2014-11-21 (×2): 25 mL via ORAL

## 2014-11-21 NOTE — Discharge Instructions (Signed)

## 2014-11-21 NOTE — ED Notes (Signed)
Patient c/o left "side" pain, flank pain .  Onset of pain complaint was Sunday, 2/14.  No other complaints of pain: no chest pain, no issues with urination, no nausea, no vomiting

## 2014-11-21 NOTE — ED Notes (Signed)
CT informed that patient has finished contrast

## 2014-11-21 NOTE — ED Provider Notes (Signed)
CSN: [id number]     Arrival date & time 11/21/14  1308 History   First MD Initiated Contact with Patient 11/21/14 1716     Chief Complaint  Patient presents with  . Abdominal Pain     (Consider location/radiation/quality/duration/timing/severity/associated sxs/prior Treatment) HPI  81 year old female presents with left lower quadrant abdominal pain for the past 3 days. Patient is not any fevers, vomiting, or diarrhea. Patient's pain seems to come and go. Patient lives with her daughter. Patient has chronic constipation is not different. Went to urgent care who referred her here for a CT scan. Does have prior history of diverticulitis but is unsure if this is exactly the same. No urinary symptoms. Unable to describe exactly what the pain feels like.  Past Medical History  Diagnosis Date  . Breast cancer   . Hypertension   . Hyperlipidemia   . GERD (gastroesophageal reflux disease)   . Diverticulosis   . Cholelithiasis   . Gout   . Osteoporosis   . Anemia   . Depression   . Hemorrhoids   . Insomnia   . Hypothyroidism   . Hiatal hernia   . Breast cancer 2000  . Chronic constipation   . Unspecified urinary incontinence   . Edema   . Encounter for long-term (current) use of other medications   . Unspecified venous (peripheral) insufficiency   . Nonspecific abnormal results of liver function study   . Gout, unspecified   . Memory loss   . Disorders of bilirubin excretion   . Neutropenia, unspecified   . Anemia, unspecified   . Other specified cardiac dysrhythmias(427.89)   . Hypopotassemia   . Disorder of bone and cartilage, unspecified   . Postmastectomy lymphedema syndrome   . Diverticulitis of colon (without mention of hemorrhage)    Past Surgical History  Procedure Laterality Date  . Mastectomy  2008    left  . Vaginal hysterectomy    . Fibroid tumor removal    . Abdominal hysterectomy     Family History  Problem Relation Age of Onset  . Lung cancer Brother      2  . Colon cancer Sister     2 sisters dx at age 68 and 67  . Heart disease Mother   . Kidney disease Brother   . Heart disease Father   . Heart disease Sister   . Cirrhosis Brother    History  Substance Use Topics  . Smoking status: Never Smoker   . Smokeless tobacco: Never Used  . Alcohol Use: No   OB History    No data available     Review of Systems  Constitutional: Negative for fever.  Gastrointestinal: Positive for abdominal pain and constipation. Negative for nausea, vomiting, diarrhea and blood in stool.  Genitourinary: Negative for dysuria.  Musculoskeletal: Negative for back pain.  All other systems reviewed and are negative.     Allergies  Review of patient's allergies indicates no known allergies.  Home Medications   Prior to Admission medications   Medication Sig Start Date End Date Taking? Authorizing Provider  aspirin 81 MG tablet Take 81 mg by mouth daily.      Historical Provider, MD  atorvastatin (LIPITOR) 10 MG tablet Take 10 mg by mouth daily.    Historical Provider, MD  atorvastatin (LIPITOR) 10 MG tablet TAKE ONE TABLET BY MOUTH ONCE DAILY TO  LOWER  CHOLESTEROL 11/06/14   Blanchie Serve, MD  donepezil (ARICEPT) 10 MG tablet Take 1 tablet (10 mg  total) by mouth at bedtime. 09/06/14   Melvenia Beam, MD  furosemide (LASIX) 20 MG tablet TAKE ONE TABLET BY MOUTH ONCE DAILY 09/04/14   Blanchie Serve, MD  levothyroxine (SYNTHROID, LEVOTHROID) 25 MCG tablet Take 1/2 tablet by mouth daily before breakfast. 05/03/14   Blanchie Serve, MD  lisinopril (PRINIVIL,ZESTRIL) 40 MG tablet TAKE ONE TABLET BY MOUTH ONCE DAILY 09/04/14   Blanchie Serve, MD  Multiple Vitamin (MULTIVITAMIN) tablet Take 1 tablet by mouth daily.      Historical Provider, MD  naproxen (NAPROSYN) 500 MG tablet TAKE ONE TABLET BY MOUTH TWICE DAILY WITH MEALS 08/07/14   Tiffany L Reed, DO  omeprazole (PRILOSEC) 20 MG capsule Take 20 mg by mouth daily.    Historical Provider, MD  oxybutynin  (DITROPAN-XL) 5 MG 24 hr tablet Take 1 tablet by mouth daily. 04/19/14   Historical Provider, MD   BP 165/71 mmHg  Pulse 63  Temp(Src) 97.8 F (36.6 C) (Oral)  Resp 16  Ht 5\' 5"  (1.651 m)  Wt 117 lb (53.071 kg)  BMI 19.47 kg/m2  SpO2 99% Physical Exam  Constitutional: She is oriented to person, place, and time. She appears well-developed and well-nourished.  HENT:  Head: Normocephalic and atraumatic.  Right Ear: External ear normal.  Left Ear: External ear normal.  Nose: Nose normal.  Eyes: Right eye exhibits no discharge. Left eye exhibits no discharge.  Cardiovascular: Normal rate, regular rhythm and normal heart sounds.   Pulmonary/Chest: Effort normal and breath sounds normal.  Abdominal: Soft. Normal appearance. She exhibits no distension. There is tenderness (mild) in the left upper quadrant and left lower quadrant.  Neurological: She is alert and oriented to person, place, and time.  Skin: Skin is warm and dry. She is not diaphoretic.  Nursing note and vitals reviewed.   ED Course  Procedures (including critical care time) Labs Review Labs Reviewed  CBC WITH DIFFERENTIAL/PLATELET - Abnormal; Notable for the following:    WBC 3.1 (*)    All other components within normal limits  COMPREHENSIVE METABOLIC PANEL - Abnormal; Notable for the following:    Total Bilirubin 1.4 (*)    GFR calc non Af Amer 45 (*)    GFR calc Af Amer 52 (*)    All other components within normal limits  LIPASE, BLOOD    Imaging Review Ct Abdomen Pelvis W Contrast  11/21/2014   CLINICAL DATA:  LEFT side and LEFT flank pain beginning on 11/19/2014, LEFT lower quadrant abdominal pain, history of breast cancer, hypertension, GERD, cholelithiasis, hiatal hernia, stroke  EXAM: CT ABDOMEN AND PELVIS WITH CONTRAST  TECHNIQUE: Multidetector CT imaging of the abdomen and pelvis was performed using the standard protocol following bolus administration of intravenous contrast. Sagittal and coronal MPR images  reconstructed from axial data set.  CONTRAST:  131mL OMNIPAQUE IOHEXOL 300 MG/ML SOLN IV. Dilute oral contrast.  COMPARISON:  02/07/2014  FINDINGS: Bibasilar atelectasis.  Calcified granulomata within liver and spleen.  Multiple BILATERAL renal cysts with additional which are intermediate in attenuation.  Cortical atrophy of LEFT kidney.  Slightly irregular cyst RIGHT lobe liver posteriorly 20 x 18 mm, previously 19 x 18 mm.  Dependent gallstones in gallbladder.  Remainder of liver, spleen, pancreas, and kidneys unremarkable.  Thickened adrenal glands bilaterally slightly greater on LEFT without discrete mass.  Stomach decompressed and suboptimally assessed.  Extensive sigmoid diverticulosis without definite evidence of diverticulitis.  Uterus surgically absent grossly unremarkable ovaries.  Remaining bowel loops, appendix, bladder and ureters normal appearance.  No definite mass, adenopathy, free fluid, or free air.  Osseous demineralization with scattered degenerative changes of the lumbar spine.  Scattered atherosclerotic calcifications without aortic aneurysm.  IMPRESSION: Cholelithiasis.  Sigmoid diverticulosis.  Hepatic and BILATERAL renal cysts with additional lesions in both kidneys which are of intermediate attenuation and grossly unchanged question complicated cysts.  No definite acute intra-abdominal or intrapelvic abnormalities.   Electronically Signed   By: Lavonia Dana M.D.   On: 11/21/2014 22:26     EKG Interpretation None      MDM   Final diagnoses:  Left lower quadrant pain    Urine from urgent care shows no UTI. Patient's pain improved with tylenol. CT shows on acute pathology. No diverticulitis, which was primary concern. No RUQ pain to suggest needing ultrasound. Has PCP f/u tomorrow, will keep this and return if symptoms worsen.    Ephraim Hamburger, MD 11/22/14 [phone]

## 2014-11-21 NOTE — ED Notes (Signed)
MD at bedside. 

## 2014-11-21 NOTE — ED Notes (Signed)
Pt sent from [facility] for further eval of LLQ pain x 3 days; pt with hx of diverticulitis

## 2014-11-21 NOTE — ED Provider Notes (Signed)
CSN: [id number]     Arrival date & time 11/21/14  1059 History   First MD Initiated Contact with Patient 11/21/14 1205     Chief Complaint  Patient presents with  . Abdominal Pain   (Consider location/radiation/quality/duration/timing/severity/associated sxs/prior Treatment) HPI Comments: Patient here with her daughter. Daughter does mention a history of diverticulitis about 3 years ago that responded favorably to medical therapy.  Remote hx of TAH (at age 81). No additional abdominal surgeries. Daughter attempted to schedule patient to be seen by her PCP, however, provider not available until tomorrow. PCP: [facility] (Dr. Bubba Camp)  Patient is a 81 y.o. female presenting with abdominal pain. The history is provided by the patient.  Abdominal Pain Pain location:  LLQ and LUQ Pain quality: aching   Pain radiates to:  L flank Pain severity:  Moderate Onset quality:  Gradual Duration:  3 days Timing:  Constant Progression:  Worsening Chronicity:  New Associated symptoms: anorexia and fatigue   Associated symptoms: no chest pain, no constipation, no diarrhea, no dysuria, no fever, no hematemesis, no hematochezia, no hematuria, no melena, no nausea, no shortness of breath, no vaginal bleeding, no vaginal discharge and no vomiting     Past Medical History  Diagnosis Date  . Breast cancer   . Hypertension   . Hyperlipidemia   . GERD (gastroesophageal reflux disease)   . Diverticulosis   . Cholelithiasis   . Gout   . Osteoporosis   . Anemia   . Depression   . Hemorrhoids   . Insomnia   . Hypothyroidism   . Hiatal hernia   . Breast cancer 2000  . Chronic constipation   . Unspecified urinary incontinence   . Edema   . Encounter for long-term (current) use of other medications   . Unspecified venous (peripheral) insufficiency   . Nonspecific abnormal results of liver function study   . Gout, unspecified   . Memory loss   . Disorders of bilirubin excretion   . Neutropenia,  unspecified   . Anemia, unspecified   . Other specified cardiac dysrhythmias(427.89)   . Hypopotassemia   . Disorder of bone and cartilage, unspecified   . Postmastectomy lymphedema syndrome   . Diverticulitis of colon (without mention of hemorrhage)    Past Surgical History  Procedure Laterality Date  . Mastectomy  2008    left  . Vaginal hysterectomy    . Fibroid tumor removal    . Abdominal hysterectomy     Family History  Problem Relation Age of Onset  . Lung cancer Brother     2  . Colon cancer Sister     2 sisters dx at age 41 and 35  . Heart disease Mother   . Kidney disease Brother   . Heart disease Father   . Heart disease Sister   . Cirrhosis Brother    History  Substance Use Topics  . Smoking status: Never Smoker   . Smokeless tobacco: Never Used  . Alcohol Use: No   OB History    No data available     Review of Systems  Constitutional: Positive for fatigue. Negative for fever.  Respiratory: Negative for shortness of breath.   Cardiovascular: Negative for chest pain.  Gastrointestinal: Positive for abdominal pain and anorexia. Negative for nausea, vomiting, diarrhea, constipation, melena, hematochezia and hematemesis.  Genitourinary: Negative for dysuria, hematuria, vaginal bleeding and vaginal discharge.    Allergies  Review of patient's allergies indicates no known allergies.  Home Medications  Prior to Admission medications   Medication Sig Start Date End Date Taking? Authorizing Provider  aspirin 81 MG tablet Take 81 mg by mouth daily.      Historical Provider, MD  atorvastatin (LIPITOR) 10 MG tablet Take 10 mg by mouth daily.    Historical Provider, MD  atorvastatin (LIPITOR) 10 MG tablet TAKE ONE TABLET BY MOUTH ONCE DAILY TO  LOWER  CHOLESTEROL 11/06/14   Blanchie Serve, MD  donepezil (ARICEPT) 10 MG tablet Take 1 tablet (10 mg total) by mouth at bedtime. 09/06/14   Melvenia Beam, MD  furosemide (LASIX) 20 MG tablet TAKE ONE TABLET BY MOUTH  ONCE DAILY 09/04/14   Blanchie Serve, MD  levothyroxine (SYNTHROID, LEVOTHROID) 25 MCG tablet Take 1/2 tablet by mouth daily before breakfast. 05/03/14   Blanchie Serve, MD  lisinopril (PRINIVIL,ZESTRIL) 40 MG tablet TAKE ONE TABLET BY MOUTH ONCE DAILY 09/04/14   Blanchie Serve, MD  Multiple Vitamin (MULTIVITAMIN) tablet Take 1 tablet by mouth daily.      Historical Provider, MD  naproxen (NAPROSYN) 500 MG tablet TAKE ONE TABLET BY MOUTH TWICE DAILY WITH MEALS 08/07/14   Tiffany L Reed, DO  omeprazole (PRILOSEC) 20 MG capsule Take 20 mg by mouth daily.    Historical Provider, MD  oxybutynin (DITROPAN-XL) 5 MG 24 hr tablet Take 1 tablet by mouth daily. 04/19/14   Historical Provider, MD   BP 157/76 mmHg  Pulse 73  Temp(Src) 97.6 F (36.4 C) (Oral)  Resp 16  SpO2 100% Physical Exam  Constitutional: She is oriented to person, place, and time. She appears well-developed and well-nourished. No distress.  Uncomfortable with ambulation  HENT:  Head: Normocephalic and atraumatic.  Eyes: Conjunctivae are normal. No scleral icterus.  Cardiovascular: Normal rate, regular rhythm and normal heart sounds.   Pulmonary/Chest: Effort normal and breath sounds normal. No respiratory distress. She has no wheezes. She exhibits no tenderness.  Abdominal: Soft. Normal appearance. She exhibits no distension and no mass. Bowel sounds are decreased. There is tenderness in the left upper quadrant and left lower quadrant. There is no rigidity, no rebound, no guarding and no CVA tenderness. Hernia confirmed negative in the ventral area.    Musculoskeletal: Normal range of motion.  Neurological: She is alert and oriented to person, place, and time.  Skin: Skin is warm and dry.  Psychiatric: She has a normal mood and affect. Her behavior is normal.  Nursing note and vitals reviewed.   ED Course  Procedures (including critical care time) Labs Review Labs Reviewed  POCT URINALYSIS DIP (DEVICE) - Abnormal; Notable for  the following:    Bilirubin Urine SMALL (*)    Ketones, ur TRACE (*)    Hgb urine dipstick TRACE (*)    All other components within normal limits  URINE CULTURE    Imaging Review No results found.   MDM   1. Left lower quadrant pain    UA grossly normal. Exam suspicious for early diverticulitis. Will transfer to [facility] ER for imaging to provide diagnostic clarification of abdominal pain in elderly female.     Lutricia Feil, [place] 11/21/14 [phone]

## 2014-11-21 NOTE — ED Notes (Signed)
Pt. Left with all belongings 

## 2014-11-22 ENCOUNTER — Ambulatory Visit (INDEPENDENT_AMBULATORY_CARE_PROVIDER_SITE_OTHER): Payer: Medicare Other | Admitting: Internal Medicine

## 2014-11-22 ENCOUNTER — Encounter: Payer: Self-pay | Admitting: Internal Medicine

## 2014-11-22 VITALS — BP 138/72 | HR 67 | Temp 97.7°F | Resp 18 | Ht 65.0 in | Wt 105.6 lb

## 2014-11-22 DIAGNOSIS — R1011 Right upper quadrant pain: Secondary | ICD-10-CM

## 2014-11-22 DIAGNOSIS — K219 Gastro-esophageal reflux disease without esophagitis: Secondary | ICD-10-CM

## 2014-11-22 DIAGNOSIS — K8020 Calculus of gallbladder without cholecystitis without obstruction: Secondary | ICD-10-CM

## 2014-11-22 DIAGNOSIS — F03B0 Unspecified dementia, moderate, without behavioral disturbance, psychotic disturbance, mood disturbance, and anxiety: Secondary | ICD-10-CM

## 2014-11-22 DIAGNOSIS — F0390 Unspecified dementia without behavioral disturbance: Secondary | ICD-10-CM

## 2014-11-22 LAB — URINE CULTURE
Colony Count: NO GROWTH
Culture: NO GROWTH
SPECIAL REQUESTS: NORMAL

## 2014-11-22 MED ORDER — PANTOPRAZOLE SODIUM 40 MG PO TBEC: 40.0000 mg | DELAYED_RELEASE_TABLET | Freq: Every day | ORAL | Status: DC

## 2014-11-22 NOTE — Progress Notes (Signed)
Patient ID: Kerry Wells, female   DOB: 12/08/1919, 81 y.o.   MRN: [id number]    Facility  [facility]    Place of Service:   OFFICE   No Known Allergies  Chief Complaint  Patient presents with  . Acute Visit    decreased appetite and stomach discomfort    HPI:  81 yo female seen today for above. She went to urgent care/ED yesterday due to worsening abdominal pain. CT abd/pelvis showed no acute process but did show gallstones. Pt does c/o intermittent nausea but no emesis. Continues to have abdominal pain in left lower quadrant intermittent. Tylenol did help yesterday with pain. Reduced appetite overall and has lost about 1 lbs since last OV. Drinking ensure 1 can per day. Tried protein shakes but she does not like them. No f/c. No CP but has occasional SOB and easily tires with walking. Daughter present and providing hx as pt poor historian due to dementia.  Medications: Patient's Medications  New Prescriptions   No medications on file  Previous Medications   ASPIRIN 81 MG TABLET    Take 81 mg by mouth daily.     ATORVASTATIN (LIPITOR) 10 MG TABLET    TAKE ONE TABLET BY MOUTH ONCE DAILY TO  LOWER  CHOLESTEROL   DONEPEZIL (ARICEPT) 10 MG TABLET    Take 1 tablet (10 mg total) by mouth at bedtime.   FUROSEMIDE (LASIX) 20 MG TABLET    TAKE ONE TABLET BY MOUTH ONCE DAILY   LEVOTHYROXINE (SYNTHROID, LEVOTHROID) 25 MCG TABLET    Take 1/2 tablet by mouth daily before breakfast.   LISINOPRIL (PRINIVIL,ZESTRIL) 40 MG TABLET    TAKE ONE TABLET BY MOUTH ONCE DAILY   MULTIPLE VITAMIN (MULTIVITAMIN) TABLET    Take 1 tablet by mouth daily.    Modified Medications   No medications on file  Discontinued Medications   NAPROXEN (NAPROSYN) 500 MG TABLET    TAKE ONE TABLET BY MOUTH TWICE DAILY WITH MEALS     Review of Systems Unable to obtain due to pt's dementia.   Filed Vitals:   11/22/14 0829  BP: 138/72  Pulse: 67  Temp: 97.7 F (36.5 C)  TempSrc: Oral  Resp: 18  Height: 5' 5"  (1.651 m)    Weight: 105 lb 9.6 oz (47.9 kg)  SpO2: 97%   Body mass index is 17.57 kg/(m^2).  Physical Exam CONSTITUTIONAL: Looks frail in NAD. Awake and alert HEENT: PERRLA. No scleral icterus. Oropharynx clear and without exudate. MMM NECK: Supple. Nontender. No palpable cervical or supraclavicular lymph nodes. No carotid bruit b/l.  CVS: Regular rate without murmur, gallop or rub. LUNGS: CTA b/l no wheezing, rales or rhonchi. ABDOMEN: Bowel sounds present x 4. Soft, nondistended. No palpable mass or bruit. RUQ TTP but no r/g/r. Epigastric TTP but no r/g/r. EXTREMITIES: No edema b/l. Distal pulses palpable. No calf tenderness PSYCH: Affect, behavior and mood normal   Labs reviewed: Admission on 11/21/2014, Discharged on 11/21/2014  Component Date Value Ref Range Status  . WBC 11/21/2014 3.1* 4.0 - 10.5 K/uL Final  . RBC 11/21/2014 4.72  3.87 - 5.11 MIL/uL Final  . Hemoglobin 11/21/2014 13.2  12.0 - 15.0 g/dL Final  . HCT 11/21/2014 40.4  36.0 - 46.0 % Final  . MCV 11/21/2014 85.6  78.0 - 100.0 fL Final  . MCH 11/21/2014 28.0  26.0 - 34.0 pg Final  . MCHC 11/21/2014 32.7  30.0 - 36.0 g/dL Final  . RDW 11/21/2014 14.5  11.5 -  15.5 % Final  . Platelets 11/21/2014 200  150 - 400 K/uL Final  . Neutrophils Relative % 11/21/2014 66  43 - 77 % Final  . Neutro Abs 11/21/2014 2.0  1.7 - 7.7 K/uL Final  . Lymphocytes Relative 11/21/2014 26  12 - 46 % Final  . Lymphs Abs 11/21/2014 0.8  0.7 - 4.0 K/uL Final  . Monocytes Relative 11/21/2014 7  3 - 12 % Final  . Monocytes Absolute 11/21/2014 0.2  0.1 - 1.0 K/uL Final  . Eosinophils Relative 11/21/2014 1  0 - 5 % Final  . Eosinophils Absolute 11/21/2014 0.0  0.0 - 0.7 K/uL Final  . Basophils Relative 11/21/2014 0  0 - 1 % Final  . Basophils Absolute 11/21/2014 0.0  0.0 - 0.1 K/uL Final  . Sodium 11/21/2014 143  135 - 145 mmol/L Final  . Potassium 11/21/2014 4.1  3.5 - 5.1 mmol/L Final  . Chloride 11/21/2014 109  96 - 112 mmol/L Final  . CO2  11/21/2014 27  19 - 32 mmol/L Final  . Glucose, Bld 11/21/2014 85  70 - 99 mg/dL Final  . BUN 11/21/2014 9  6 - 23 mg/dL Final  . Creatinine, Ser 11/21/2014 1.05  0.50 - 1.10 mg/dL Final  . Calcium 11/21/2014 9.2  8.4 - 10.5 mg/dL Final  . Total Protein 11/21/2014 6.1  6.0 - 8.3 g/dL Final  . Albumin 11/21/2014 3.6  3.5 - 5.2 g/dL Final  . AST 11/21/2014 30  0 - 37 U/L Final  . ALT 11/21/2014 27  0 - 35 U/L Final  . Alkaline Phosphatase 11/21/2014 72  39 - 117 U/L Final  . Total Bilirubin 11/21/2014 1.4* 0.3 - 1.2 mg/dL Final  . GFR calc non Af Amer 11/21/2014 45* >90 mL/min Final  . GFR calc Af Amer 11/21/2014 52* >90 mL/min Final   Comment: (NOTE) The eGFR has been calculated using the CKD EPI equation. This calculation has not been validated in all clinical situations. eGFR's persistently <90 mL/min signify possible Chronic Kidney Disease.   . Anion gap 11/21/2014 7  5 - 15 Final  . Lipase 11/21/2014 21  11 - 59 U/L Final  Admission on 11/21/2014, Discharged on 11/21/2014  Component Date Value Ref Range Status  . Glucose, UA 11/21/2014 NEGATIVE  NEGATIVE mg/dL Final  . Bilirubin Urine 11/21/2014 SMALL* NEGATIVE Final  . Ketones, ur 11/21/2014 TRACE* NEGATIVE mg/dL Final  . Specific Gravity, Urine 11/21/2014 1.025  1.005 - 1.030 Final  . Hgb urine dipstick 11/21/2014 TRACE* NEGATIVE Final  . pH 11/21/2014 6.0  5.0 - 8.0 Final  . Protein, ur 11/21/2014 NEGATIVE  NEGATIVE mg/dL Final  . Urobilinogen, UA 11/21/2014 1.0  0.0 - 1.0 mg/dL Final  . Nitrite 11/21/2014 NEGATIVE  NEGATIVE Final  . Leukocytes, UA 11/21/2014 NEGATIVE  NEGATIVE Final   Biochemical Testing Only. Please order routine urinalysis from main lab if confirmatory testing is needed.   CLINICAL DATA: LEFT side and LEFT flank pain beginning on 11/19/2014, LEFT lower quadrant abdominal pain, history of breast cancer, hypertension, GERD, cholelithiasis, hiatal hernia, stroke  EXAM: CT ABDOMEN AND PELVIS WITH  CONTRAST  TECHNIQUE: Multidetector CT imaging of the abdomen and pelvis was performed using the standard protocol following bolus administration of intravenous contrast. Sagittal and coronal MPR images reconstructed from axial data set.  CONTRAST: 183m OMNIPAQUE IOHEXOL 300 MG/ML SOLN IV. Dilute oral contrast.  COMPARISON: 02/07/2014  FINDINGS: Bibasilar atelectasis.  Calcified granulomata within liver and spleen.  Multiple BILATERAL renal  cysts with additional which are intermediate in attenuation.  Cortical atrophy of LEFT kidney.  Slightly irregular cyst RIGHT lobe liver posteriorly 20 x 18 mm, previously 19 x 18 mm.  Dependent gallstones in gallbladder.  Remainder of liver, spleen, pancreas, and kidneys unremarkable.  Thickened adrenal glands bilaterally slightly greater on LEFT without discrete mass.  Stomach decompressed and suboptimally assessed.  Extensive sigmoid diverticulosis without definite evidence of diverticulitis.  Uterus surgically absent grossly unremarkable ovaries.  Remaining bowel loops, appendix, bladder and ureters normal appearance.  No definite mass, adenopathy, free fluid, or free air.  Osseous demineralization with scattered degenerative changes of the lumbar spine.  Scattered atherosclerotic calcifications without aortic aneurysm.  IMPRESSION: Cholelithiasis.  Sigmoid diverticulosis.  Hepatic and BILATERAL renal cysts with additional lesions in both kidneys which are of intermediate attenuation and grossly unchanged question complicated cysts.  No definite acute intra-abdominal or intrapelvic abnormalities.   Electronically Signed  By: Lavonia Dana M.D.  On: 11/21/2014 22:26   Chart reviewed; ED records reviewed  Assessment/Plan   ICD-9-CM ICD-10-CM   1. Calculus of gallbladder without cholecystitis without obstruction 574.20 K80.20 Ambulatory referral to General Surgery  2. Abdominal  pain, right upper quadrant 789.01 R10.11   3. Moderate dementia without behavioral disturbance 290.0 F03.90   4. Gastroesophageal reflux disease without esophagitis 530.81 K21.9 pantoprazole (PROTONIX) 40 MG tablet    --d/c OTC prilosec. Rx pantoprazole daily  --continue other medications as Rx  --wil refer to surgery for their input in managing symptomatic gallstones, but informed daughter she may not be a surgical candidate due to her advanced age and dementia. She would like to d/w surgery anyhow.  --f/u as scheduled  Hillman Attig S. Perlie Gold  [facility] [address] [place], [place] [address] [phone] Office (Wednesdays and Fridays 8 AM - 5 PM) [phone] Cell (Monday-Friday 8 AM - 5 PM)

## 2014-11-22 NOTE — Patient Instructions (Signed)
Stop OTC prilosec  Continue other medications as ordered

## 2014-11-27 ENCOUNTER — Other Ambulatory Visit: Payer: Self-pay | Admitting: Internal Medicine

## 2014-11-28 ENCOUNTER — Encounter: Payer: Self-pay | Admitting: Internal Medicine

## 2014-12-04 ENCOUNTER — Emergency Department (HOSPITAL_COMMUNITY)
Admission: EM | Admit: 2014-12-04 | Discharge: 2014-12-04 | Disposition: A | Discharge status: Home / Self Care | Payer: Medicare Other | Attending: Emergency Medicine | Admitting: Emergency Medicine

## 2014-12-04 ENCOUNTER — Encounter (HOSPITAL_COMMUNITY): Payer: Self-pay | Admitting: *Deleted

## 2014-12-04 ENCOUNTER — Telehealth: Payer: Self-pay

## 2014-12-04 DIAGNOSIS — E785 Hyperlipidemia, unspecified: Secondary | ICD-10-CM | POA: Diagnosis not present

## 2014-12-04 DIAGNOSIS — R109 Unspecified abdominal pain: Secondary | ICD-10-CM | POA: Diagnosis present

## 2014-12-04 DIAGNOSIS — Z7982 Long term (current) use of aspirin: Secondary | ICD-10-CM | POA: Diagnosis not present

## 2014-12-04 DIAGNOSIS — E039 Hypothyroidism, unspecified: Secondary | ICD-10-CM | POA: Diagnosis not present

## 2014-12-04 DIAGNOSIS — K8020 Calculus of gallbladder without cholecystitis without obstruction: Secondary | ICD-10-CM | POA: Diagnosis not present

## 2014-12-04 DIAGNOSIS — F329 Major depressive disorder, single episode, unspecified: Secondary | ICD-10-CM | POA: Diagnosis not present

## 2014-12-04 DIAGNOSIS — K219 Gastro-esophageal reflux disease without esophagitis: Secondary | ICD-10-CM | POA: Insufficient documentation

## 2014-12-04 DIAGNOSIS — I10 Essential (primary) hypertension: Secondary | ICD-10-CM | POA: Insufficient documentation

## 2014-12-04 DIAGNOSIS — Z8739 Personal history of other diseases of the musculoskeletal system and connective tissue: Secondary | ICD-10-CM | POA: Diagnosis not present

## 2014-12-04 DIAGNOSIS — Z853 Personal history of malignant neoplasm of breast: Secondary | ICD-10-CM | POA: Insufficient documentation

## 2014-12-04 DIAGNOSIS — Z79899 Other long term (current) drug therapy: Secondary | ICD-10-CM | POA: Diagnosis not present

## 2014-12-04 DIAGNOSIS — R1032 Left lower quadrant pain: Secondary | ICD-10-CM

## 2014-12-04 DIAGNOSIS — D649 Anemia, unspecified: Secondary | ICD-10-CM | POA: Insufficient documentation

## 2014-12-04 DIAGNOSIS — Z8719 Personal history of other diseases of the digestive system: Secondary | ICD-10-CM

## 2014-12-04 DIAGNOSIS — Z8669 Personal history of other diseases of the nervous system and sense organs: Secondary | ICD-10-CM | POA: Insufficient documentation

## 2014-12-04 LAB — CBC WITH DIFFERENTIAL/PLATELET
Basophils Absolute: 0 10*3/uL (ref 0.0–0.1)
Basophils Relative: 0 % (ref 0–1)
Eosinophils Absolute: 0.1 10*3/uL (ref 0.0–0.7)
Eosinophils Relative: 4 % (ref 0–5)
HCT: 38.5 % (ref 36.0–46.0)
Hemoglobin: 12.6 g/dL (ref 12.0–15.0)
Lymphocytes Relative: 35 % (ref 12–46)
Lymphs Abs: 1 10*3/uL (ref 0.7–4.0)
MCH: 28.2 pg (ref 26.0–34.0)
MCHC: 32.7 g/dL (ref 30.0–36.0)
MCV: 86.1 fL (ref 78.0–100.0)
Monocytes Absolute: 0.3 10*3/uL (ref 0.1–1.0)
Monocytes Relative: 11 % (ref 3–12)
Neutro Abs: 1.5 10*3/uL — ABNORMAL LOW (ref 1.7–7.7)
Neutrophils Relative %: 50 % (ref 43–77)
Platelets: 175 10*3/uL (ref 150–400)
RBC: 4.47 MIL/uL (ref 3.87–5.11)
RDW: 14.8 % (ref 11.5–15.5)
WBC: 2.9 10*3/uL — ABNORMAL LOW (ref 4.0–10.5)

## 2014-12-04 LAB — COMPREHENSIVE METABOLIC PANEL
ALT: 20 U/L (ref 0–35)
AST: 24 U/L (ref 0–37)
Albumin: 3.4 g/dL — ABNORMAL LOW (ref 3.5–5.2)
Alkaline Phosphatase: 58 U/L (ref 39–117)
Anion gap: 5 (ref 5–15)
BUN: 12 mg/dL (ref 6–23)
CO2: 28 mmol/L (ref 19–32)
Calcium: 8.9 mg/dL (ref 8.4–10.5)
Chloride: 107 mmol/L (ref 96–112)
Creatinine, Ser: 1.16 mg/dL — ABNORMAL HIGH (ref 0.50–1.10)
GFR calc Af Amer: 46 mL/min — ABNORMAL LOW (ref >90)
GFR calc non Af Amer: 40 mL/min — ABNORMAL LOW (ref >90)
Glucose, Bld: 98 mg/dL (ref 70–99)
Potassium: 3.7 mmol/L (ref 3.5–5.1)
Sodium: 140 mmol/L (ref 135–145)
Total Bilirubin: 0.8 mg/dL (ref 0.3–1.2)
Total Protein: 5.8 g/dL — ABNORMAL LOW (ref 6.0–8.3)

## 2014-12-04 MED ORDER — MORPHINE SULFATE 4 MG/ML IJ SOLN
4.0000 mg | Freq: Once | INTRAMUSCULAR | Status: AC
  Administered 2014-12-04: 4 mg via INTRAVENOUS
  Filled 2014-12-04: qty 1

## 2014-12-04 MED ORDER — TRAMADOL HCL 50 MG PO TABS: 50.0000 mg | ORAL_TABLET | Freq: Two times a day (BID) | ORAL | Status: DC | PRN

## 2014-12-04 NOTE — ED Provider Notes (Signed)
CSN: [id number]     Arrival date & time 12/04/14  1601 History   First MD Initiated Contact with Patient 12/04/14 1755     Chief Complaint  Patient presents with  . Abdominal Pain     (Consider location/radiation/quality/duration/timing/severity/associated sxs/prior Treatment) HPI Pt is a 81yo female with hx of breast cancer, HTN, hyperlipidemia, GERD, diverticulosis and diverticulitis, cholelithiasis, chronic constipation, presenting to ED with daughter with request for pain medication.  Daughter states pt was seen last week for LLQ abdominal pain, diagnosed with gallstones, has f/u consultation appointment with general surgery on 12/11/14, but states pain is too bad to wait until then.  Pt describes pain is aching and sore, not relieved by tylenol at home.  Pain waxes and wanes, 8/10 at worst. Nothing makes better or worse.  Denies fever, chills, n/v/d. Last BM was yesterday. Denies urinary symptoms.    Past Medical History  Diagnosis Date  . Breast cancer   . Hypertension   . Hyperlipidemia   . GERD (gastroesophageal reflux disease)   . Diverticulosis   . Cholelithiasis   . Gout   . Osteoporosis   . Anemia   . Depression   . Hemorrhoids   . Insomnia   . Hypothyroidism   . Hiatal hernia   . Breast cancer 2000  . Chronic constipation   . Unspecified urinary incontinence   . Edema   . Encounter for long-term (current) use of other medications   . Unspecified venous (peripheral) insufficiency   . Nonspecific abnormal results of liver function study   . Gout, unspecified   . Memory loss   . Disorders of bilirubin excretion   . Neutropenia, unspecified   . Anemia, unspecified   . Other specified cardiac dysrhythmias(427.89)   . Hypopotassemia   . Disorder of bone and cartilage, unspecified   . Postmastectomy lymphedema syndrome   . Diverticulitis of colon (without mention of hemorrhage)    Past Surgical History  Procedure Laterality Date  . Mastectomy  2008    left  .  Vaginal hysterectomy    . Fibroid tumor removal    . Abdominal hysterectomy     Family History  Problem Relation Age of Onset  . Lung cancer Brother     2  . Colon cancer Sister     2 sisters dx at age 38 and 21  . Heart disease Mother   . Kidney disease Brother   . Heart disease Father   . Heart disease Sister   . Cirrhosis Brother    History  Substance Use Topics  . Smoking status: Never Smoker   . Smokeless tobacco: Never Used  . Alcohol Use: No   OB History    No data available     Review of Systems  Constitutional: Negative for fever and chills.  Respiratory: Negative for cough and shortness of breath.   Cardiovascular: Negative for chest pain.  Gastrointestinal: Positive for abdominal pain. Negative for nausea, vomiting, diarrhea and constipation.  All other systems reviewed and are negative.     Allergies  Review of patient's allergies indicates no known allergies.  Home Medications   Prior to Admission medications   Medication Sig Start Date End Date Taking? Authorizing Provider  aspirin 81 MG tablet Take 81 mg by mouth daily.     Yes Historical Provider, MD  atorvastatin (LIPITOR) 10 MG tablet TAKE ONE TABLET BY MOUTH ONCE DAILY TO  LOWER  CHOLESTEROL 11/06/14  Yes Blanchie Serve, MD  donepezil (ARICEPT)  10 MG tablet Take 1 tablet (10 mg total) by mouth at bedtime. 09/06/14  Yes Melvenia Beam, MD  furosemide (LASIX) 20 MG tablet TAKE ONE TABLET BY MOUTH ONCE DAILY 11/27/14  Yes Mahima Bubba Camp, MD  levothyroxine (SYNTHROID, LEVOTHROID) 25 MCG tablet TAKE ONE TABLET BY MOUTH ONCE DAILY BEFORE  BREAKFAST 11/27/14  Yes Mahima Bubba Camp, MD  lisinopril (PRINIVIL,ZESTRIL) 40 MG tablet TAKE ONE TABLET BY MOUTH ONCE DAILY 11/27/14  Yes Blanchie Serve, MD  Multiple Vitamin (MULTIVITAMIN) tablet Take 1 tablet by mouth daily.     Yes Historical Provider, MD  pantoprazole (PROTONIX) 40 MG tablet Take 1 tablet (40 mg total) by mouth daily. 11/22/14  Yes [employer],  DO  POTASSIUM PO Take 1 tablet by mouth daily. "over the counter potassium supplement"   Yes Historical Provider, MD  levothyroxine (SYNTHROID, LEVOTHROID) 25 MCG tablet Take 1/2 tablet by mouth daily before breakfast. Patient not taking: Reported on 12/04/2014 05/03/14   Blanchie Serve, MD  traMADol (ULTRAM) 50 MG tablet Take 1 tablet (50 mg total) by mouth every 12 (twelve) hours as needed. 12/04/14   Noland Fordyce, PA-C   BP 185/57 mmHg  Pulse 51  Temp(Src) 98.3 F (36.8 C) (Oral)  Resp 9  Ht 5\' 5"  (1.651 m)  Wt 112 lb (50.803 kg)  BMI 18.64 kg/m2  SpO2 100% Physical Exam  Constitutional: She appears well-developed and well-nourished. No distress.  Elderly female lying in exam bed, NAD.  HENT:  Head: Normocephalic and atraumatic.  Eyes: Conjunctivae are normal. No scleral icterus.  Neck: Normal range of motion.  Cardiovascular: Normal rate, regular rhythm and normal heart sounds.   Pulmonary/Chest: Effort normal and breath sounds normal. No respiratory distress. She has no wheezes. She has no rales. She exhibits no tenderness.  Abdominal: Soft. Bowel sounds are normal. She exhibits no distension and no mass. There is tenderness. There is no rebound and no guarding.  Soft, non-distended, tender to LLQ and left side of abdomen. No rebound or guarding. No masses palpated. No CVAT.   Musculoskeletal: Normal range of motion.  Neurological: She is alert.  Skin: Skin is warm and dry. She is not diaphoretic.  Nursing note and vitals reviewed.   ED Course  Procedures (including critical care time) Labs Review Labs Reviewed  COMPREHENSIVE METABOLIC PANEL - Abnormal; Notable for the following:    Creatinine, Ser 1.16 (*)    Total Protein 5.8 (*)    Albumin 3.4 (*)    GFR calc non Af Amer 40 (*)    GFR calc Af Amer 46 (*)    All other components within normal limits  CBC WITH DIFFERENTIAL/PLATELET - Abnormal; Notable for the following:    WBC 2.9 (*)    Neutro Abs 1.5 (*)    All  other components within normal limits    Imaging Review No results found.   EKG Interpretation None      MDM   Final diagnoses:  LLQ pain  History of gallstones    Pt is a 81yo female with hx of LLQ pain and gallstones, presenting to ED with request for pain medication stronger than tylenol. Pt appears well, non-toxic. Tenderness in LLQ. Previous medical records reviewed including CT abd on 11/21/14, significant for gall stones but no LLQ findings.  Normal UA at that time too.  Pt is tender to LLQ, no RUQ tenderness.  Discussed pt with Dr. Vanita Panda, labs repeated. Unremarkable. Pt stable for discharge home. Rx: tramadol. Home care instructions  provided including f/u with PCP and general surgery as previously scheduled for March 7th. Return precautions provided. Pt and family verbalized understanding and agreement with tx plan.     Noland Fordyce, PA-C 12/04/14 2223  Carmin Muskrat, MD 12/05/14 [phone]

## 2014-12-04 NOTE — Telephone Encounter (Signed)
I called Shirlean Mylar (daughter) also and got the same recording.  I called Richardson Landry (patients son), patient has tried tylenol with no relief  Please advise

## 2014-12-04 NOTE — ED Notes (Signed)
Pt reports here last week for gallstones, states has appointment with surgeon March 7th. But pain increased, wanting something stronger for pain meds.

## 2014-12-04 NOTE — Telephone Encounter (Signed)
Has she tried taking tylenol? If so, does it help?

## 2014-12-04 NOTE — Telephone Encounter (Signed)
Phone number is not accepting calls at this time. Call back later it states. I have tried twice.

## 2014-12-04 NOTE — Telephone Encounter (Signed)
Patient's daughter called to leave a triage message: Patient with right side pain (10) on scale of 1-10. Patient with pending appointment to see surgeon on 12/11/14. Patients daughter state pain limits her mobility. Patients daughter would like pain medication for her mother while she awaits seeing surgeon. Pharmacy on file confirmed as [facility] on [address]   Last appointment 11/22/14

## 2014-12-04 NOTE — Telephone Encounter (Signed)
Ok to try Tramadol 50 mg 1 po BID  prn pain with food #30 no RF

## 2014-12-05 NOTE — Telephone Encounter (Signed)
Patient in ER now, Tramadol was rx'ed

## 2014-12-07 ENCOUNTER — Ambulatory Visit: Payer: Medicare Other | Admitting: Neurology

## 2014-12-11 ENCOUNTER — Other Ambulatory Visit: Payer: Self-pay | Admitting: Internal Medicine

## 2014-12-12 ENCOUNTER — Ambulatory Visit (INDEPENDENT_AMBULATORY_CARE_PROVIDER_SITE_OTHER): Payer: Medicare Other | Admitting: Internal Medicine

## 2014-12-12 ENCOUNTER — Encounter: Payer: Self-pay | Admitting: Internal Medicine

## 2014-12-12 VITALS — BP 128/62 | HR 68 | Temp 97.5°F | Resp 18 | Ht 65.0 in | Wt 106.4 lb

## 2014-12-12 DIAGNOSIS — R1032 Left lower quadrant pain: Secondary | ICD-10-CM | POA: Diagnosis not present

## 2014-12-12 DIAGNOSIS — F0390 Unspecified dementia without behavioral disturbance: Secondary | ICD-10-CM | POA: Diagnosis not present

## 2014-12-12 DIAGNOSIS — K8020 Calculus of gallbladder without cholecystitis without obstruction: Secondary | ICD-10-CM | POA: Diagnosis not present

## 2014-12-12 DIAGNOSIS — R634 Abnormal weight loss: Secondary | ICD-10-CM

## 2014-12-12 DIAGNOSIS — K5900 Constipation, unspecified: Secondary | ICD-10-CM

## 2014-12-12 DIAGNOSIS — F03B0 Unspecified dementia, moderate, without behavioral disturbance, psychotic disturbance, mood disturbance, and anxiety: Secondary | ICD-10-CM

## 2014-12-12 MED ORDER — POLYETHYLENE GLYCOL 3350 17 GM/SCOOP PO POWD: ORAL | Status: DC

## 2014-12-12 MED ORDER — ATORVASTATIN CALCIUM 10 MG PO TABS: ORAL_TABLET | ORAL | Status: DC

## 2014-12-12 NOTE — Progress Notes (Signed)
Patient ID: Kerry Wells, female   DOB: 1920-01-23, 81 y.o.   MRN: [id number]    Chief Complaint  Patient presents with  . Acute Visit    assess for surgical clearance   No Known Allergies  HPI Patient is seen in the room today with her daughter. She was having abdominal pain, had ct abdomen showing gall bladder stones and diverticulosis. She was then referred to surgery to assess for cholecystectomy. She was in the ED again with abdominal pain but in LLQ relieved with narcotics. She complaints of abdominal pain today in LLQ. No pain in RUQ. Denies any nausea or vomiting. has been having constipation and no bowel movement for 2 days. Has been avoiding fatty food and fried food. Has hx of dementia, old CVA, HTN, brain mass with concerns of metastases (further workup refused by patient) Daughter is concerned with her losing weight. Appetite is poor and is taking ensure supplements  Wt Readings from Last 3 Encounters:  12/12/14 106 lb 6.4 oz (48.263 kg)  12/04/14 112 lb (50.803 kg)  11/22/14 105 lb 9.6 oz (47.9 kg)   ROS Denies chest pain or dyspnea Denies palpitations or dyspnea Denies headache Denies diarrhea Denies urinary complaints  Past Medical History  Diagnosis Date  . Breast cancer   . Hypertension   . Hyperlipidemia   . GERD (gastroesophageal reflux disease)   . Diverticulosis   . Cholelithiasis   . Gout   . Osteoporosis   . Anemia   . Depression   . Hemorrhoids   . Insomnia   . Hypothyroidism   . Hiatal hernia   . Breast cancer 2000  . Chronic constipation   . Unspecified urinary incontinence   . Edema   . Encounter for long-term (current) use of other medications   . Unspecified venous (peripheral) insufficiency   . Nonspecific abnormal results of liver function study   . Gout, unspecified   . Memory loss   . Disorders of bilirubin excretion   . Neutropenia, unspecified   . Anemia, unspecified   . Other specified cardiac dysrhythmias(427.89)   .  Hypopotassemia   . Disorder of bone and cartilage, unspecified   . Postmastectomy lymphedema syndrome   . Diverticulitis of colon (without mention of hemorrhage)    Current Outpatient Prescriptions on File Prior to Visit  Medication Sig Dispense Refill  . aspirin 81 MG tablet Take 81 mg by mouth daily.      . furosemide (LASIX) 20 MG tablet TAKE ONE TABLET BY MOUTH ONCE DAILY 30 tablet 1  . levothyroxine (SYNTHROID, LEVOTHROID) 25 MCG tablet Take 1/2 tablet by mouth daily before breakfast. 15 tablet 2  . lisinopril (PRINIVIL,ZESTRIL) 40 MG tablet TAKE ONE TABLET BY MOUTH ONCE DAILY 30 tablet 1  . Multiple Vitamin (MULTIVITAMIN) tablet Take 1 tablet by mouth daily.      . pantoprazole (PROTONIX) 40 MG tablet Take 1 tablet (40 mg total) by mouth daily. 30 tablet 3  . POTASSIUM PO Take 1 tablet by mouth daily. "over the counter potassium supplement"     No current facility-administered medications on file prior to visit.    Physical exam BP 128/62 mmHg  Pulse 68  Temp(Src) 97.5 F (36.4 C) (Oral)  Resp 18  Ht 5\' 5"  (1.651 m)  Wt 106 lb 6.4 oz (48.263 kg)  BMI 17.71 kg/m2  SpO2 98%  General- elderly frail female in no acute distress Head- atraumatic, normocephalic Neck- no lymphadenopathy Mouth- normal mucus membrane Cardiovascular- normal s1,s2,  no murmurs, palpable normal distal pulses Respiratory- bilateral clear to auscultation, no wheeze, no rhonchi, no crackles Abdomen- bowel sounds present, soft, some discomfort in LLQ on exam, no guarding or rigidity, no RUQ pain and no rebound tenderness  Musculoskeletal- able to move all 4 extremities, no leg edema Neurological- alert to self Skin- warm and dry Psychiatry-  normal mood and affect  Labs  CMP Latest Ref Rng 12/04/2014 11/21/2014 02/07/2014  Glucose 70 - 99 mg/dL 98 85 81  BUN 6 - 23 mg/dL 12 9 11   Creatinine 0.50 - 1.10 mg/dL 1.16(H) 1.05 0.99  Sodium 135 - 145 mmol/L 140 143 145  Potassium 3.5 - 5.1 mmol/L 3.7 4.1 4.3   Chloride 96 - 112 mmol/L 107 109 108  CO2 19 - 32 mmol/L 28 27 27   Calcium 8.4 - 10.5 mg/dL 8.9 9.2 8.3(L)  Total Protein 6.0 - 8.3 g/dL 5.8(L) 6.1 -  Albumin 3.2 - 4.6 g/dL - - -  Total Bilirubin 0.3 - 1.2 mg/dL 0.8 1.4(H) -  Alkaline Phos 39 - 117 U/L 58 72 -  AST 0 - 37 U/L 24 30 -  ALT 0 - 35 U/L 20 27 -   Imaging Ct abdomen/pelvis 11/21/14 IMPRESSION: Cholelithiasis.   Sigmoid diverticulosis.   Hepatic and BILATERAL renal cysts with additional lesions in both kidneys which are of intermediate attenuation and grossly unchanged question complicated cysts.   No definite acute intra-abdominal or intrapelvic abnormalities.   Ct abdomen/pelvis 02/07/14 IMPRESSION: 1. No primary malignancy identified within the chest, abdomen or pelvis. Is there a history of breast cancer? 2. No specific signs of metastatic disease. There are small indeterminate pulmonary nodules bilaterally. Largest nodule in the right middle lobe is unchanged from abdominal CTs performed more than 5 years ago. 3. Grossly stable low density renal lesions bilaterally. Left renal cortical scarring and atrophy.   Assessment/plan  1. Abdominal pain, left lower quadrant i have concerns for diverticular pain with constipation worsening it. Start miralax bid x 3 days, then once a day to help with bowel movement, reassess. D/c tramadol.  - CMP  2. Calculus of gallbladder without cholecystitis without obstruction No RUQ pain at present on exam. No cholecystitis on ct abdomen. Given her age, dementia, multiple brain mass with concern of metastases and no ongoing pain/ cholecystitis would recommend to hold off on surgery. Family agrees. Avoid fried food and fatty meals, conservative management for now  3. Constipation, unspecified constipation type See above  4. Moderate dementia without behavioral disturbance Continues to decline, is losing weight, d/c aricept for now. Family is inclined more towards comfort care  but do not want any forms filled out at present  5. Loss of weight D/c aricept as it can contribute to weight loss. Her ongoing dementia and ? Malignancy could be contributing as well.

## 2014-12-13 LAB — COMPREHENSIVE METABOLIC PANEL
ALT: 19 IU/L (ref 0–32)
AST: 23 IU/L (ref 0–40)
Albumin/Globulin Ratio: 2.2 (ref 1.1–2.5)
Albumin: 4 g/dL (ref 3.2–4.6)
Alkaline Phosphatase: 63 IU/L (ref 39–117)
BILIRUBIN TOTAL: 1.2 mg/dL (ref 0.0–1.2)
BUN/Creatinine Ratio: 12 (ref 11–26)
BUN: 13 mg/dL (ref 10–36)
CO2: 27 mmol/L (ref 18–29)
CREATININE: 1.08 mg/dL — AB (ref 0.57–1.00)
Calcium: 9.1 mg/dL (ref 8.7–10.3)
Chloride: 101 mmol/L (ref 97–108)
GFR calc non Af Amer: 45 mL/min/{1.73_m2} — ABNORMAL LOW (ref >59)
GFR, EST AFRICAN AMERICAN: 51 mL/min/{1.73_m2} — AB (ref >59)
Globulin, Total: 1.8 g/dL (ref 1.5–4.5)
Glucose: 112 mg/dL — ABNORMAL HIGH (ref 65–99)
Potassium: 3.5 mmol/L (ref 3.5–5.2)
SODIUM: 142 mmol/L (ref 134–144)
TOTAL PROTEIN: 5.8 g/dL — AB (ref 6.0–8.5)

## 2014-12-20 ENCOUNTER — Telehealth: Payer: Self-pay | Admitting: Internal Medicine

## 2014-12-20 NOTE — Telephone Encounter (Signed)
Called pt  Spoke with a gentlemen, inquired about patients flu shot for this year.  Says he was sure she had a flu shot, did not know when.  Says he would call back..Carolin Coy

## 2015-01-23 ENCOUNTER — Ambulatory Visit: Payer: Medicare Other | Admitting: Internal Medicine

## 2015-01-23 ENCOUNTER — Other Ambulatory Visit: Payer: Self-pay | Admitting: Internal Medicine

## 2015-01-24 ENCOUNTER — Encounter: Payer: Self-pay | Admitting: Internal Medicine

## 2015-01-24 ENCOUNTER — Ambulatory Visit (INDEPENDENT_AMBULATORY_CARE_PROVIDER_SITE_OTHER): Payer: Medicare Other | Admitting: Internal Medicine

## 2015-01-24 VITALS — BP 104/68 | HR 54 | Temp 96.9°F | Wt 103.0 lb

## 2015-01-24 DIAGNOSIS — I10 Essential (primary) hypertension: Secondary | ICD-10-CM

## 2015-01-24 DIAGNOSIS — E038 Other specified hypothyroidism: Secondary | ICD-10-CM | POA: Diagnosis not present

## 2015-01-24 DIAGNOSIS — K219 Gastro-esophageal reflux disease without esophagitis: Secondary | ICD-10-CM | POA: Diagnosis not present

## 2015-01-24 DIAGNOSIS — R7309 Other abnormal glucose: Secondary | ICD-10-CM | POA: Diagnosis not present

## 2015-01-24 DIAGNOSIS — R7303 Prediabetes: Secondary | ICD-10-CM

## 2015-01-24 DIAGNOSIS — E034 Atrophy of thyroid (acquired): Secondary | ICD-10-CM | POA: Diagnosis not present

## 2015-01-24 DIAGNOSIS — E785 Hyperlipidemia, unspecified: Secondary | ICD-10-CM | POA: Diagnosis not present

## 2015-01-24 DIAGNOSIS — F0390 Unspecified dementia without behavioral disturbance: Secondary | ICD-10-CM

## 2015-01-24 DIAGNOSIS — F03B0 Unspecified dementia, moderate, without behavioral disturbance, psychotic disturbance, mood disturbance, and anxiety: Secondary | ICD-10-CM

## 2015-01-24 MED ORDER — PANTOPRAZOLE SODIUM 40 MG PO TBEC: 40.0000 mg | DELAYED_RELEASE_TABLET | Freq: Every day | ORAL | Status: DC

## 2015-01-24 MED ORDER — FUROSEMIDE 20 MG PO TABS: 20.0000 mg | ORAL_TABLET | Freq: Every day | ORAL | Status: DC

## 2015-01-24 MED ORDER — LISINOPRIL 40 MG PO TABS: 40.0000 mg | ORAL_TABLET | Freq: Every day | ORAL | Status: DC

## 2015-01-24 MED ORDER — LEVOTHYROXINE SODIUM 25 MCG PO TABS: ORAL_TABLET | ORAL | Status: DC

## 2015-01-24 MED ORDER — ATORVASTATIN CALCIUM 10 MG PO TABS: ORAL_TABLET | ORAL | Status: DC

## 2015-01-24 NOTE — Patient Instructions (Signed)
Continue current medications  Follow up in 3 mos. Fasting labs prior to appt

## 2015-01-24 NOTE — Progress Notes (Signed)
Patient ID: Kerry Wells, female   DOB: [birth year], 81 y.o.   MRN: [id number]    Facility  [facility]    Place of Service:   OFFICE    No Known Allergies  Chief Complaint  Patient presents with  . Medical Management of Chronic Issues    2 month follow-up, CMP completed on 12/12/14. Patients is in the same state as last OV except constipation improved.    HPI:  81 yo female seen today for f/u. No further abdominal pain after taking laxative. She saw sx and no surgery indicated at this time.  Her appetite is pretty good and denies N/V. She is not sleeping well at night. She gets confused about the time of day and attempts to get dressed.she does wonder when she gets confused. She then sleeps during the day. She has dementia and is a poor historian. Hx obtained from daughter.  She is taking all medications as ordered.  Past Medical History  Diagnosis Date  . Breast cancer   . Hypertension   . Hyperlipidemia   . GERD (gastroesophageal reflux disease)   . Diverticulosis   . Cholelithiasis   . Gout   . Osteoporosis   . Anemia   . Depression   . Hemorrhoids   . Insomnia   . Hypothyroidism   . Hiatal hernia   . Breast cancer 2000  . Chronic constipation   . Unspecified urinary incontinence   . Edema   . Encounter for long-term (current) use of other medications   . Unspecified venous (peripheral) insufficiency   . Nonspecific abnormal results of liver function study   . Gout, unspecified   . Memory loss   . Disorders of bilirubin excretion   . Neutropenia, unspecified   . Anemia, unspecified   . Other specified cardiac dysrhythmias(427.89)   . Hypopotassemia   . Disorder of bone and cartilage, unspecified   . Postmastectomy lymphedema syndrome   . Diverticulitis of colon (without mention of hemorrhage)    Past Surgical History  Procedure Laterality Date  . Mastectomy  2008    left  . Vaginal hysterectomy    . Fibroid tumor removal    . Abdominal hysterectomy      History   Social History  . Marital Status: Widowed    Spouse Name: N/A  . Number of Children: 6  . Years of Education: N/A   Social History Main Topics  . Smoking status: Never Smoker   . Smokeless tobacco: Never Used  . Alcohol Use: No  . Drug Use: No  . Sexual Activity: Not on file   Other Topics Concern  . None   Social History Narrative   Patient lives at home with daughter and son.    Patient has 6 children   Patient is widowed    Patient has a high school education    Patient is retired         Medications: Patient's Medications  New Prescriptions   No medications on file  Previous Medications   ASPIRIN 81 MG TABLET    Take 81 mg by mouth daily.     ATORVASTATIN (LIPITOR) 10 MG TABLET    TAKE ONE TABLET BY MOUTH ONCE DAILY TO  LOWER  CHOLESTEROL   FUROSEMIDE (LASIX) 20 MG TABLET    TAKE ONE TABLET BY MOUTH ONCE DAILY   LEVOTHYROXINE (SYNTHROID, LEVOTHROID) 25 MCG TABLET    Take 1/2 tablet by mouth daily before breakfast.   LISINOPRIL (PRINIVIL,ZESTRIL) 40 MG  TABLET    TAKE ONE TABLET BY MOUTH ONCE DAILY   MULTIPLE VITAMIN (MULTIVITAMIN) TABLET    Take 1 tablet by mouth daily.     PANTOPRAZOLE (PROTONIX) 40 MG TABLET    Take 1 tablet (40 mg total) by mouth daily.   POTASSIUM PO    Take 1 tablet by mouth daily. "over the counter potassium supplement"  Modified Medications   No medications on file  Discontinued Medications   POLYETHYLENE GLYCOL POWDER (GLYCOLAX/MIRALAX) POWDER    Take 1 packet twice a day for 3 days from the sample and then once a day for constipation     Review of Systems  Unable to perform ROS: Dementia    Filed Vitals:   01/24/15 1137  BP: 104/68  Pulse: 54  Temp: 96.9 F (36.1 C)  TempSrc: Oral  Weight: 103 lb (46.72 kg)  - previously 106 lbs in March 2016  SpO2: 96%   Body mass index is 17.14 kg/(m^2).  Physical Exam  Constitutional: She appears well-nourished.  Frail appearing in NAD  HENT:  Mouth/Throat: Oropharynx is  clear and moist. No oropharyngeal exudate.  Eyes: Pupils are equal, round, and reactive to light. No scleral icterus.  Neck: Neck supple. No tracheal deviation present. No thyromegaly present.  Cardiovascular: Normal rate, regular rhythm, normal heart sounds and intact distal pulses.  Exam reveals no gallop and no friction rub.   No murmur heard. No LE edema b/l. no calf TTP. No carotid bruit b/l  Pulmonary/Chest: Effort normal and breath sounds normal. No stridor. No respiratory distress. She has no wheezes. She has no rales.  Abdominal: Soft. Bowel sounds are normal. She exhibits no distension and no mass. There is no tenderness. There is no rebound and no guarding.  Musculoskeletal: She exhibits tenderness. She exhibits no edema.  Lymphadenopathy:    She has no cervical adenopathy.  Neurological: She is alert. She has normal reflexes.  Skin: Skin is warm and dry. No rash noted.  Psychiatric: She has a normal mood and affect. Her behavior is normal.     Labs reviewed: Office Visit on 12/12/2014  Component Date Value Ref Range Status  . Glucose 12/12/2014 112* 65 - 99 mg/dL Final  . BUN 12/12/2014 13  10 - 36 mg/dL Final  . Creatinine, Ser 12/12/2014 1.08* 0.57 - 1.00 mg/dL Final  . GFR calc non Af Amer 12/12/2014 45* >59 mL/min/1.73 Final  . GFR calc Af Amer 12/12/2014 51* >59 mL/min/1.73 Final  . BUN/Creatinine Ratio 12/12/2014 12  11 - 26 Final  . Sodium 12/12/2014 142  134 - 144 mmol/L Final  . Potassium 12/12/2014 3.5  3.5 - 5.2 mmol/L Final  . Chloride 12/12/2014 101  97 - 108 mmol/L Final  . CO2 12/12/2014 27  18 - 29 mmol/L Final  . Calcium 12/12/2014 9.1  8.7 - 10.3 mg/dL Final  . Total Protein 12/12/2014 5.8* 6.0 - 8.5 g/dL Final  . Albumin 12/12/2014 4.0  3.2 - 4.6 g/dL Final  . Globulin, Total 12/12/2014 1.8  1.5 - 4.5 g/dL Final  . Albumin/Globulin Ratio 12/12/2014 2.2  1.1 - 2.5 Final  . Bilirubin Total 12/12/2014 1.2  0.0 - 1.2 mg/dL Final  . Alkaline Phosphatase  12/12/2014 63  39 - 117 IU/L Final  . AST 12/12/2014 23  0 - 40 IU/L Final  . ALT 12/12/2014 19  0 - 32 IU/L Final  Admission on 12/04/2014, Discharged on 12/04/2014  Component Date Value Ref Range Status  . Sodium  12/04/2014 140  135 - 145 mmol/L Final  . Potassium 12/04/2014 3.7  3.5 - 5.1 mmol/L Final  . Chloride 12/04/2014 107  96 - 112 mmol/L Final  . CO2 12/04/2014 28  19 - 32 mmol/L Final  . Glucose, Bld 12/04/2014 98  70 - 99 mg/dL Final  . BUN 12/04/2014 12  6 - 23 mg/dL Final  . Creatinine, Ser 12/04/2014 1.16* 0.50 - 1.10 mg/dL Final  . Calcium 12/04/2014 8.9  8.4 - 10.5 mg/dL Final  . Total Protein 12/04/2014 5.8* 6.0 - 8.3 g/dL Final  . Albumin 12/04/2014 3.4* 3.5 - 5.2 g/dL Final  . AST 12/04/2014 24  0 - 37 U/L Final  . ALT 12/04/2014 20  0 - 35 U/L Final  . Alkaline Phosphatase 12/04/2014 58  39 - 117 U/L Final  . Total Bilirubin 12/04/2014 0.8  0.3 - 1.2 mg/dL Final  . GFR calc non Af Amer 12/04/2014 40* >90 mL/min Final  . GFR calc Af Amer 12/04/2014 46* >90 mL/min Final   Comment: (NOTE) The eGFR has been calculated using the CKD EPI equation. This calculation has not been validated in all clinical situations. eGFR's persistently <90 mL/min signify possible Chronic Kidney Disease.   . Anion gap 12/04/2014 5  5 - 15 Final  . WBC 12/04/2014 2.9* 4.0 - 10.5 K/uL Final  . RBC 12/04/2014 4.47  3.87 - 5.11 MIL/uL Final  . Hemoglobin 12/04/2014 12.6  12.0 - 15.0 g/dL Final  . HCT 12/04/2014 38.5  36.0 - 46.0 % Final  . MCV 12/04/2014 86.1  78.0 - 100.0 fL Final  . MCH 12/04/2014 28.2  26.0 - 34.0 pg Final  . MCHC 12/04/2014 32.7  30.0 - 36.0 g/dL Final  . RDW 12/04/2014 14.8  11.5 - 15.5 % Final  . Platelets 12/04/2014 175  150 - 400 K/uL Final  . Neutrophils Relative % 12/04/2014 50  43 - 77 % Final  . Neutro Abs 12/04/2014 1.5* 1.7 - 7.7 K/uL Final  . Lymphocytes Relative 12/04/2014 35  12 - 46 % Final  . Lymphs Abs 12/04/2014 1.0  0.7 - 4.0 K/uL Final  .  Monocytes Relative 12/04/2014 11  3 - 12 % Final  . Monocytes Absolute 12/04/2014 0.3  0.1 - 1.0 K/uL Final  . Eosinophils Relative 12/04/2014 4  0 - 5 % Final  . Eosinophils Absolute 12/04/2014 0.1  0.0 - 0.7 K/uL Final  . Basophils Relative 12/04/2014 0  0 - 1 % Final  . Basophils Absolute 12/04/2014 0.0  0.0 - 0.1 K/uL Final  Admission on 11/21/2014, Discharged on 11/21/2014  Component Date Value Ref Range Status  . WBC 11/21/2014 3.1* 4.0 - 10.5 K/uL Final  . RBC 11/21/2014 4.72  3.87 - 5.11 MIL/uL Final  . Hemoglobin 11/21/2014 13.2  12.0 - 15.0 g/dL Final  . HCT 11/21/2014 40.4  36.0 - 46.0 % Final  . MCV 11/21/2014 85.6  78.0 - 100.0 fL Final  . MCH 11/21/2014 28.0  26.0 - 34.0 pg Final  . MCHC 11/21/2014 32.7  30.0 - 36.0 g/dL Final  . RDW 11/21/2014 14.5  11.5 - 15.5 % Final  . Platelets 11/21/2014 200  150 - 400 K/uL Final  . Neutrophils Relative % 11/21/2014 66  43 - 77 % Final  . Neutro Abs 11/21/2014 2.0  1.7 - 7.7 K/uL Final  . Lymphocytes Relative 11/21/2014 26  12 - 46 % Final  . Lymphs Abs 11/21/2014 0.8  0.7 - 4.0 K/uL  Final  . Monocytes Relative 11/21/2014 7  3 - 12 % Final  . Monocytes Absolute 11/21/2014 0.2  0.1 - 1.0 K/uL Final  . Eosinophils Relative 11/21/2014 1  0 - 5 % Final  . Eosinophils Absolute 11/21/2014 0.0  0.0 - 0.7 K/uL Final  . Basophils Relative 11/21/2014 0  0 - 1 % Final  . Basophils Absolute 11/21/2014 0.0  0.0 - 0.1 K/uL Final  . Sodium 11/21/2014 143  135 - 145 mmol/L Final  . Potassium 11/21/2014 4.1  3.5 - 5.1 mmol/L Final  . Chloride 11/21/2014 109  96 - 112 mmol/L Final  . CO2 11/21/2014 27  19 - 32 mmol/L Final  . Glucose, Bld 11/21/2014 85  70 - 99 mg/dL Final  . BUN 11/21/2014 9  6 - 23 mg/dL Final  . Creatinine, Ser 11/21/2014 1.05  0.50 - 1.10 mg/dL Final  . Calcium 11/21/2014 9.2  8.4 - 10.5 mg/dL Final  . Total Protein 11/21/2014 6.1  6.0 - 8.3 g/dL Final  . Albumin 11/21/2014 3.6  3.5 - 5.2 g/dL Final  . AST 11/21/2014 30  0  - 37 U/L Final  . ALT 11/21/2014 27  0 - 35 U/L Final  . Alkaline Phosphatase 11/21/2014 72  39 - 117 U/L Final  . Total Bilirubin 11/21/2014 1.4* 0.3 - 1.2 mg/dL Final  . GFR calc non Af Amer 11/21/2014 45* >90 mL/min Final  . GFR calc Af Amer 11/21/2014 52* >90 mL/min Final   Comment: (NOTE) The eGFR has been calculated using the CKD EPI equation. This calculation has not been validated in all clinical situations. eGFR's persistently <90 mL/min signify possible Chronic Kidney Disease.   . Anion gap 11/21/2014 7  5 - 15 Final  . Lipase 11/21/2014 21  11 - 59 U/L Final  Admission on 11/21/2014, Discharged on 11/21/2014  Component Date Value Ref Range Status  . Specimen Description 11/21/2014 URINE, CLEAN CATCH   Final  . Special Requests 11/21/2014 none Normal   Final  . Colony Count 11/21/2014    Final                   Value:NO GROWTH Performed at [facility]   . Culture 11/21/2014    Final                   Value:NO GROWTH Performed at [facility]   . Report Status 11/21/2014 11/22/2014 FINAL   Final  . Glucose, UA 11/21/2014 NEGATIVE  NEGATIVE mg/dL Final  . Bilirubin Urine 11/21/2014 SMALL* NEGATIVE Final  . Ketones, ur 11/21/2014 TRACE* NEGATIVE mg/dL Final  . Specific Gravity, Urine 11/21/2014 1.025  1.005 - 1.030 Final  . Hgb urine dipstick 11/21/2014 TRACE* NEGATIVE Final  . pH 11/21/2014 6.0  5.0 - 8.0 Final  . Protein, ur 11/21/2014 NEGATIVE  NEGATIVE mg/dL Final  . Urobilinogen, UA 11/21/2014 1.0  0.0 - 1.0 mg/dL Final  . Nitrite 11/21/2014 NEGATIVE  NEGATIVE Final  . Leukocytes, UA 11/21/2014 NEGATIVE  NEGATIVE Final   Biochemical Testing Only. Please order routine urinalysis from main lab if confirmatory testing is needed.     Assessment/Plan   ICD-9-CM ICD-10-CM   1. Gastroesophageal reflux disease without esophagitis - cont protonix 530.81 K21.9 pantoprazole (PROTONIX) 40 MG tablet  2. Moderate dementia without behavioral disturbance -  cont redirection 290.0 F03.90 CMP  3. Essential hypertension, benign - controlled on lisinopril and lasix 401.1 I10 CMP  4. Hyperlipidemia - stable on  statin 272.4 E78.5 Lipid Panel  5. Hypothyroidism due to acquired atrophy of thyroid - cont levothyroxine 244.8 E03.8 TSH   246.8 E03.4 T4, Free  6. Prediabetes 790.29 R73.09 CMP     Hemoglobin A1c    --continue current medications as ordered  --watch complex CHO intake  --check fasting labs prior to appt. RTO in 3 mos for f/u.  Quinley Nesler S. Perlie Gold  [facility] and [facility] [address] [place], [place] [address] [phone] Office (Wednesdays and Fridays 8 AM - 5 PM) [phone] Cell (Monday-Friday 8 AM - 5 PM)

## 2015-03-02 ENCOUNTER — Encounter: Payer: Self-pay | Admitting: Gastroenterology

## 2015-03-12 ENCOUNTER — Encounter: Payer: Self-pay | Admitting: Internal Medicine

## 2015-03-23 ENCOUNTER — Other Ambulatory Visit: Payer: Self-pay | Admitting: Internal Medicine

## 2015-04-03 ENCOUNTER — Other Ambulatory Visit: Payer: Self-pay

## 2015-04-03 DIAGNOSIS — K219 Gastro-esophageal reflux disease without esophagitis: Secondary | ICD-10-CM

## 2015-04-03 MED ORDER — PANTOPRAZOLE SODIUM 40 MG PO TBEC: 40.0000 mg | DELAYED_RELEASE_TABLET | Freq: Every day | ORAL | Status: DC

## 2015-04-16 ENCOUNTER — Other Ambulatory Visit: Payer: Medicare Other

## 2015-04-16 DIAGNOSIS — E785 Hyperlipidemia, unspecified: Secondary | ICD-10-CM

## 2015-04-16 DIAGNOSIS — E034 Atrophy of thyroid (acquired): Secondary | ICD-10-CM

## 2015-04-16 DIAGNOSIS — F03B0 Unspecified dementia, moderate, without behavioral disturbance, psychotic disturbance, mood disturbance, and anxiety: Secondary | ICD-10-CM

## 2015-04-16 DIAGNOSIS — F0390 Unspecified dementia without behavioral disturbance: Secondary | ICD-10-CM

## 2015-04-16 DIAGNOSIS — I10 Essential (primary) hypertension: Secondary | ICD-10-CM

## 2015-04-16 DIAGNOSIS — R7303 Prediabetes: Secondary | ICD-10-CM

## 2015-04-17 LAB — COMPREHENSIVE METABOLIC PANEL
ALK PHOS: 61 IU/L (ref 39–117)
ALT: 20 IU/L (ref 0–32)
AST: 20 IU/L (ref 0–40)
Albumin/Globulin Ratio: 1.8 (ref 1.1–2.5)
Albumin: 3.5 g/dL (ref 3.2–4.6)
BUN / CREAT RATIO: 15 (ref 11–26)
BUN: 17 mg/dL (ref 10–36)
Bilirubin Total: 1.3 mg/dL — ABNORMAL HIGH (ref 0.0–1.2)
CHLORIDE: 105 mmol/L (ref 97–108)
CO2: 24 mmol/L (ref 18–29)
Calcium: 8.9 mg/dL (ref 8.7–10.3)
Creatinine, Ser: 1.16 mg/dL — ABNORMAL HIGH (ref 0.57–1.00)
GFR calc Af Amer: 47 mL/min/{1.73_m2} — ABNORMAL LOW (ref >59)
GFR, EST NON AFRICAN AMERICAN: 41 mL/min/{1.73_m2} — AB (ref >59)
GLOBULIN, TOTAL: 1.9 g/dL (ref 1.5–4.5)
Glucose: 77 mg/dL (ref 65–99)
Potassium: 3.9 mmol/L (ref 3.5–5.2)
Sodium: 143 mmol/L (ref 134–144)
TOTAL PROTEIN: 5.4 g/dL — AB (ref 6.0–8.5)

## 2015-04-17 LAB — LIPID PANEL
CHOL/HDL RATIO: 1.9 ratio (ref 0.0–4.4)
CHOLESTEROL TOTAL: 120 mg/dL (ref 100–199)
HDL: 64 mg/dL (ref >39)
LDL CALC: 47 mg/dL (ref 0–99)
Triglycerides: 45 mg/dL (ref 0–149)
VLDL CHOLESTEROL CAL: 9 mg/dL (ref 5–40)

## 2015-04-17 LAB — TSH: TSH: 0.815 u[IU]/mL (ref 0.450–4.500)

## 2015-04-17 LAB — T4, FREE: Free T4: 1.31 ng/dL (ref 0.82–1.77)

## 2015-04-17 LAB — HEMOGLOBIN A1C
Est. average glucose Bld gHb Est-mCnc: 120 mg/dL
Hgb A1c MFr Bld: 5.8 % — ABNORMAL HIGH (ref 4.8–5.6)

## 2015-04-18 ENCOUNTER — Encounter: Payer: Self-pay | Admitting: Internal Medicine

## 2015-04-18 ENCOUNTER — Ambulatory Visit (INDEPENDENT_AMBULATORY_CARE_PROVIDER_SITE_OTHER): Payer: Medicare Other | Admitting: Internal Medicine

## 2015-04-18 VITALS — BP 118/72 | HR 71 | Temp 97.7°F | Resp 18 | Ht 65.0 in | Wt 98.4 lb

## 2015-04-18 DIAGNOSIS — E034 Atrophy of thyroid (acquired): Secondary | ICD-10-CM | POA: Insufficient documentation

## 2015-04-18 DIAGNOSIS — K219 Gastro-esophageal reflux disease without esophagitis: Secondary | ICD-10-CM | POA: Diagnosis not present

## 2015-04-18 DIAGNOSIS — E785 Hyperlipidemia, unspecified: Secondary | ICD-10-CM | POA: Diagnosis not present

## 2015-04-18 DIAGNOSIS — F0391 Unspecified dementia with behavioral disturbance: Secondary | ICD-10-CM

## 2015-04-18 DIAGNOSIS — R7309 Other abnormal glucose: Secondary | ICD-10-CM | POA: Diagnosis not present

## 2015-04-18 DIAGNOSIS — R634 Abnormal weight loss: Secondary | ICD-10-CM | POA: Diagnosis not present

## 2015-04-18 DIAGNOSIS — K8020 Calculus of gallbladder without cholecystitis without obstruction: Secondary | ICD-10-CM | POA: Diagnosis not present

## 2015-04-18 DIAGNOSIS — I10 Essential (primary) hypertension: Secondary | ICD-10-CM | POA: Diagnosis not present

## 2015-04-18 DIAGNOSIS — E038 Other specified hypothyroidism: Secondary | ICD-10-CM

## 2015-04-18 DIAGNOSIS — R7303 Prediabetes: Secondary | ICD-10-CM

## 2015-04-18 NOTE — Progress Notes (Signed)
Patient ID: Kerry Wells, female   DOB: [birth year], 81 y.o.   MRN: [id number]    Location:    [facility]   Place of Service:   OFFICE  Chief Complaint  Patient presents with  . Follow-up    HPI:  81 yo female seen today for f/u. Daughter has noticed a decline. Memory is worsening and she is losing weight. Her sons are not ready to place her in SNF. Daughter is primary caregiver but sons not ready yet. Years ago, she had a conversation with mother and she indicated she would prefer to go in rest home in lieu of "being a burden" on anyone. Recently, she awakens at night confused and attempts to leave home. They are using deadbolt on door now. She gets angry some times. Occasionally she gets physical and "swings at you". Her appetite is poor and she does not eat a lot. It takes her all day to drink 1 ensure. It will be a family decision to place her in SNF.  Daughter reports pt has a DNR at home.   BP stable on furosemide and lisinopril  She takes lipitor for cholesterol. Daughter also makes sure she does not consume a lot of fatty foods due to her gallstones  Thyroid stable on levothyroxine  Past Medical History  Diagnosis Date  . Breast cancer   . Hypertension   . Hyperlipidemia   . GERD (gastroesophageal reflux disease)   . Diverticulosis   . Cholelithiasis   . Gout   . Osteoporosis   . Anemia   . Depression   . Hemorrhoids   . Insomnia   . Hypothyroidism   . Hiatal hernia   . Breast cancer 2000  . Chronic constipation   . Unspecified urinary incontinence   . Edema   . Encounter for long-term (current) use of other medications   . Unspecified venous (peripheral) insufficiency   . Nonspecific abnormal results of liver function study   . Gout, unspecified   . Memory loss   . Disorders of bilirubin excretion   . Neutropenia, unspecified   . Anemia, unspecified   . Other specified cardiac dysrhythmias(427.89)   . Hypopotassemia   . Disorder of bone and cartilage,  unspecified   . Postmastectomy lymphedema syndrome   . Diverticulitis of colon (without mention of hemorrhage)     Past Surgical History  Procedure Laterality Date  . Mastectomy  2008    left  . Vaginal hysterectomy    . Fibroid tumor removal    . Abdominal hysterectomy      Patient Care Team: Gildardo Cranker, DO as PCP - General (Internal Medicine)  History   Social History  . Marital Status: Widowed    Spouse Name: N/A  . Number of Children: 6  . Years of Education: N/A   Occupational History  . Not on file.   Social History Main Topics  . Smoking status: Never Smoker   . Smokeless tobacco: Never Used  . Alcohol Use: No  . Drug Use: No  . Sexual Activity: Not on file   Other Topics Concern  . Not on file   Social History Narrative   Patient lives at home with daughter and son.    Patient has 6 children   Patient is widowed    Patient has a high school education    Patient is retired         reports that she has never smoked. She has never used smokeless tobacco. She  reports that she does not drink alcohol or use illicit drugs.  No Known Allergies  Medications: Patient's Medications  New Prescriptions   No medications on file  Previous Medications   ASPIRIN 81 MG TABLET    Take 81 mg by mouth daily.     ATORVASTATIN (LIPITOR) 10 MG TABLET    TAKE ONE TABLET BY MOUTH ONCE DAILY TO  LOWER  CHOLESTEROL   FUROSEMIDE (LASIX) 20 MG TABLET    Take 1 tablet (20 mg total) by mouth daily.   LEVOTHYROXINE (SYNTHROID, LEVOTHROID) 25 MCG TABLET    Take 1/2 tablet by mouth daily before breakfast.   LISINOPRIL (PRINIVIL,ZESTRIL) 40 MG TABLET    Take 1 tablet (40 mg total) by mouth daily.   MULTIPLE VITAMIN (MULTIVITAMIN) TABLET    Take 1 tablet by mouth daily.     PANTOPRAZOLE (PROTONIX) 40 MG TABLET    Take 1 tablet (40 mg total) by mouth daily.   POTASSIUM PO    Take 1 tablet by mouth daily. "over the counter potassium supplement"  Modified Medications   No  medications on file  Discontinued Medications   No medications on file    Review of Systems  Unable to perform ROS: Dementia    Filed Vitals:   04/18/15 1127  BP: 118/72  Pulse: 71  Temp: 97.7 F (36.5 C)  TempSrc: Oral  Resp: 18  Height: 5\' 5"  (1.651 m)  Weight: 98 lb 6.4 oz (44.634 kg)  Down 5 lbs since last OV  SpO2: 98%   Body mass index is 16.37 kg/(m^2).  Physical Exam  Constitutional: She appears well-developed and well-nourished.  HENT:  Mouth/Throat: Oropharynx is clear and moist. No oropharyngeal exudate.  Eyes: Pupils are equal, round, and reactive to light. No scleral icterus.  Neck: Neck supple. Carotid bruit is not present. No tracheal deviation present. No thyromegaly present.  Cardiovascular: Normal rate, regular rhythm, normal heart sounds and intact distal pulses.  Exam reveals no gallop and no friction rub.   No murmur heard. Trace LE edema b/l. no calf TTP.   Pulmonary/Chest: Effort normal and breath sounds normal. No stridor. No respiratory distress. She has no wheezes. She has no rales.  Abdominal: Soft. Bowel sounds are normal. She exhibits no distension and no mass. There is no hepatomegaly. There is no tenderness. There is no rebound and no guarding.  Lymphadenopathy:    She has no cervical adenopathy.  Neurological: She is alert.  Skin: Skin is warm and dry. No rash noted.  Psychiatric: She has a normal mood and affect. Her behavior is normal.     Labs reviewed: Appointment on 04/16/2015  Component Date Value Ref Range Status  . Glucose 04/16/2015 77  65 - 99 mg/dL Final  . BUN 04/16/2015 17  10 - 36 mg/dL Final  . Creatinine, Ser 04/16/2015 1.16* 0.57 - 1.00 mg/dL Final  . GFR calc non Af Amer 04/16/2015 41* >59 mL/min/1.73 Final  . GFR calc Af Amer 04/16/2015 47* >59 mL/min/1.73 Final  . BUN/Creatinine Ratio 04/16/2015 15  11 - 26 Final  . Sodium 04/16/2015 143  134 - 144 mmol/L Final  . Potassium 04/16/2015 3.9  3.5 - 5.2 mmol/L Final    . Chloride 04/16/2015 105  97 - 108 mmol/L Final  . CO2 04/16/2015 24  18 - 29 mmol/L Final  . Calcium 04/16/2015 8.9  8.7 - 10.3 mg/dL Final  . Total Protein 04/16/2015 5.4* 6.0 - 8.5 g/dL Final  . Albumin 04/16/2015  3.5  3.2 - 4.6 g/dL Final  . Globulin, Total 04/16/2015 1.9  1.5 - 4.5 g/dL Final  . Albumin/Globulin Ratio 04/16/2015 1.8  1.1 - 2.5 Final  . Bilirubin Total 04/16/2015 1.3* 0.0 - 1.2 mg/dL Final  . Alkaline Phosphatase 04/16/2015 61  39 - 117 IU/L Final  . AST 04/16/2015 20  0 - 40 IU/L Final  . ALT 04/16/2015 20  0 - 32 IU/L Final  . Cholesterol, Total 04/16/2015 120  100 - 199 mg/dL Final  . Triglycerides 04/16/2015 45  0 - 149 mg/dL Final  . HDL 04/16/2015 64  >39 mg/dL Final   Comment: According to ATP-III Guidelines, HDL-C >59 mg/dL is considered a negative risk factor for CHD.   Marland Kitchen VLDL Cholesterol Cal 04/16/2015 9  5 - 40 mg/dL Final  . LDL Calculated 04/16/2015 47  0 - 99 mg/dL Final  . Chol/HDL Ratio 04/16/2015 1.9  0.0 - 4.4 ratio units Final   Comment:                                   T. Chol/HDL Ratio                                             Men  Women                               1/2 Avg.Risk  3.4    3.3                                   Avg.Risk  5.0    4.4                                2X Avg.Risk  9.6    7.1                                3X Avg.Risk 23.4   11.0   . TSH 04/16/2015 0.815  0.450 - 4.500 uIU/mL Final  . Free T4 04/16/2015 1.31  0.82 - 1.77 ng/dL Final  . Hgb A1c MFr Bld 04/16/2015 5.8* 4.8 - 5.6 % Final   Comment:          Pre-diabetes: 5.7 - 6.4          Diabetes: >6.4          Glycemic control for adults with diabetes: <7.0   . Est. average glucose Bld gHb Est-m* 04/16/2015 120   Final    No results found.   Assessment/Plan   ICD-9-CM ICD-10-CM   1. Loss of weight due to #2 783.21 R63.4   2. Dementia, with behavioral disturbance - worsening sx's 294.21 F03.91   3. Hypothyroidism due to acquired atrophy of thyroid -  stable 244.8 E03.8    246.8 E03.4   4. Prediabetes - stable 790.29 R73.09   5. Hyperlipidemia - controlled 272.4 E78.5   6. Essential hypertension, benign - stable 401.1 I10   7. Calculus of gallbladder without cholecystitis without obstruction - stable 574.20 K80.20   8. Gastroesophageal  reflux disease without esophagitis - controlled 530.81 K21.9    --Continue current medications as ordered  --Follow up in 3 mos for routine visit  --Encourage you to drink at least 2 ensures daily  --daughter to bring copy of DNR to office so that it can be scanned into chart  Yogi Arther S. Perlie Gold  [facility] and [facility] [address] [place], [place] [address] [phone] Cell (Monday-Friday 8 AM - 5 PM) [phone] After 5 PM and follow prompts

## 2015-04-18 NOTE — Patient Instructions (Signed)
Continue current medications as ordered  Follow up in 3 mos for routine visit  Encourage you to drink at least 2 ensures daily

## 2015-04-23 ENCOUNTER — Other Ambulatory Visit: Payer: Medicare Other

## 2015-04-25 ENCOUNTER — Ambulatory Visit: Payer: Medicare Other | Admitting: Internal Medicine

## 2015-05-17 ENCOUNTER — Inpatient Hospital Stay (HOSPITAL_COMMUNITY): Payer: Medicare Other

## 2015-05-17 ENCOUNTER — Inpatient Hospital Stay (HOSPITAL_COMMUNITY)
Admission: EM | Admit: 2015-05-17 | Discharge: 2015-05-23 | DRG: 065 | Disposition: A | Discharge status: Hospice | Payer: Medicare Other | Attending: Neurology | Admitting: Neurology

## 2015-05-17 ENCOUNTER — Emergency Department (HOSPITAL_COMMUNITY): Payer: Medicare Other

## 2015-05-17 DIAGNOSIS — I972 Postmastectomy lymphedema syndrome: Secondary | ICD-10-CM | POA: Diagnosis present

## 2015-05-17 DIAGNOSIS — Z66 Do not resuscitate: Secondary | ICD-10-CM | POA: Diagnosis present

## 2015-05-17 DIAGNOSIS — Z7982 Long term (current) use of aspirin: Secondary | ICD-10-CM | POA: Diagnosis not present

## 2015-05-17 DIAGNOSIS — Z9012 Acquired absence of left breast and nipple: Secondary | ICD-10-CM | POA: Diagnosis present

## 2015-05-17 DIAGNOSIS — G8191 Hemiplegia, unspecified affecting right dominant side: Secondary | ICD-10-CM | POA: Diagnosis present

## 2015-05-17 DIAGNOSIS — E039 Hypothyroidism, unspecified: Secondary | ICD-10-CM | POA: Diagnosis present

## 2015-05-17 DIAGNOSIS — Z515 Encounter for palliative care: Secondary | ICD-10-CM | POA: Diagnosis not present

## 2015-05-17 DIAGNOSIS — I618 Other nontraumatic intracerebral hemorrhage: Principal | ICD-10-CM | POA: Diagnosis present

## 2015-05-17 DIAGNOSIS — E46 Unspecified protein-calorie malnutrition: Secondary | ICD-10-CM | POA: Diagnosis present

## 2015-05-17 DIAGNOSIS — G936 Cerebral edema: Secondary | ICD-10-CM | POA: Diagnosis not present

## 2015-05-17 DIAGNOSIS — Z853 Personal history of malignant neoplasm of breast: Secondary | ICD-10-CM

## 2015-05-17 DIAGNOSIS — R32 Unspecified urinary incontinence: Secondary | ICD-10-CM | POA: Diagnosis present

## 2015-05-17 DIAGNOSIS — G939 Disorder of brain, unspecified: Secondary | ICD-10-CM | POA: Diagnosis present

## 2015-05-17 DIAGNOSIS — E785 Hyperlipidemia, unspecified: Secondary | ICD-10-CM | POA: Diagnosis present

## 2015-05-17 DIAGNOSIS — I10 Essential (primary) hypertension: Secondary | ICD-10-CM

## 2015-05-17 DIAGNOSIS — F0390 Unspecified dementia without behavioral disturbance: Secondary | ICD-10-CM | POA: Diagnosis present

## 2015-05-17 DIAGNOSIS — Z79899 Other long term (current) drug therapy: Secondary | ICD-10-CM | POA: Diagnosis not present

## 2015-05-17 DIAGNOSIS — D649 Anemia, unspecified: Secondary | ICD-10-CM | POA: Diagnosis present

## 2015-05-17 DIAGNOSIS — K5900 Constipation, unspecified: Secondary | ICD-10-CM | POA: Diagnosis present

## 2015-05-17 DIAGNOSIS — M810 Age-related osteoporosis without current pathological fracture: Secondary | ICD-10-CM | POA: Diagnosis present

## 2015-05-17 DIAGNOSIS — I872 Venous insufficiency (chronic) (peripheral): Secondary | ICD-10-CM | POA: Diagnosis present

## 2015-05-17 DIAGNOSIS — C7931 Secondary malignant neoplasm of brain: Secondary | ICD-10-CM | POA: Diagnosis not present

## 2015-05-17 DIAGNOSIS — G4700 Insomnia, unspecified: Secondary | ICD-10-CM | POA: Diagnosis present

## 2015-05-17 DIAGNOSIS — K219 Gastro-esophageal reflux disease without esophagitis: Secondary | ICD-10-CM | POA: Diagnosis present

## 2015-05-17 DIAGNOSIS — R4182 Altered mental status, unspecified: Secondary | ICD-10-CM

## 2015-05-17 DIAGNOSIS — Z681 Body mass index (BMI) 19 or less, adult: Secondary | ICD-10-CM | POA: Diagnosis not present

## 2015-05-17 DIAGNOSIS — Z90710 Acquired absence of both cervix and uterus: Secondary | ICD-10-CM | POA: Diagnosis not present

## 2015-05-17 DIAGNOSIS — M109 Gout, unspecified: Secondary | ICD-10-CM | POA: Diagnosis present

## 2015-05-17 DIAGNOSIS — F329 Major depressive disorder, single episode, unspecified: Secondary | ICD-10-CM | POA: Diagnosis present

## 2015-05-17 DIAGNOSIS — I619 Nontraumatic intracerebral hemorrhage, unspecified: Secondary | ICD-10-CM | POA: Diagnosis present

## 2015-05-17 DIAGNOSIS — I610 Nontraumatic intracerebral hemorrhage in hemisphere, subcortical: Secondary | ICD-10-CM | POA: Diagnosis not present

## 2015-05-17 LAB — I-STAT TROPONIN, ED: TROPONIN I, POC: 0 ng/mL (ref 0.00–0.08)

## 2015-05-17 LAB — COMPREHENSIVE METABOLIC PANEL
ALT: 25 U/L (ref 14–54)
AST: 26 U/L (ref 15–41)
Albumin: 3.1 g/dL — ABNORMAL LOW (ref 3.5–5.0)
Alkaline Phosphatase: 61 U/L (ref 38–126)
Anion gap: 4 — ABNORMAL LOW (ref 5–15)
BILIRUBIN TOTAL: 1.1 mg/dL (ref 0.3–1.2)
BUN: 19 mg/dL (ref 6–20)
CO2: 27 mmol/L (ref 22–32)
CREATININE: 1.28 mg/dL — AB (ref 0.44–1.00)
Calcium: 8.7 mg/dL — ABNORMAL LOW (ref 8.9–10.3)
Chloride: 110 mmol/L (ref 101–111)
GFR, EST AFRICAN AMERICAN: 41 mL/min — AB (ref >60)
GFR, EST NON AFRICAN AMERICAN: 35 mL/min — AB (ref >60)
Glucose, Bld: 121 mg/dL — ABNORMAL HIGH (ref 65–99)
Potassium: 4 mmol/L (ref 3.5–5.1)
Sodium: 141 mmol/L (ref 135–145)
Total Protein: 5.5 g/dL — ABNORMAL LOW (ref 6.5–8.1)

## 2015-05-17 LAB — GLUCOSE, CAPILLARY
GLUCOSE-CAPILLARY: 77 mg/dL (ref 65–99)
Glucose-Capillary: 75 mg/dL (ref 65–99)

## 2015-05-17 LAB — CBC
HCT: 34.4 % — ABNORMAL LOW (ref 36.0–46.0)
Hemoglobin: 11.4 g/dL — ABNORMAL LOW (ref 12.0–15.0)
MCH: 29 pg (ref 26.0–34.0)
MCHC: 33.1 g/dL (ref 30.0–36.0)
MCV: 87.5 fL (ref 78.0–100.0)
PLATELETS: 169 10*3/uL (ref 150–400)
RBC: 3.93 MIL/uL (ref 3.87–5.11)
RDW: 15.4 % (ref 11.5–15.5)
WBC: 2.6 10*3/uL — ABNORMAL LOW (ref 4.0–10.5)

## 2015-05-17 LAB — RAPID URINE DRUG SCREEN, HOSP PERFORMED
Amphetamines: NOT DETECTED
BARBITURATES: NOT DETECTED
BENZODIAZEPINES: NOT DETECTED
Cocaine: NOT DETECTED
Opiates: NOT DETECTED
Tetrahydrocannabinol: NOT DETECTED

## 2015-05-17 LAB — URINALYSIS, ROUTINE W REFLEX MICROSCOPIC
BILIRUBIN URINE: NEGATIVE
Glucose, UA: NEGATIVE mg/dL
HGB URINE DIPSTICK: NEGATIVE
KETONES UR: NEGATIVE mg/dL
Leukocytes, UA: NEGATIVE
NITRITE: NEGATIVE
PH: 6 (ref 5.0–8.0)
Protein, ur: NEGATIVE mg/dL
Specific Gravity, Urine: 1.011 (ref 1.005–1.030)
UROBILINOGEN UA: 0.2 mg/dL (ref 0.0–1.0)

## 2015-05-17 LAB — I-STAT CHEM 8, ED
BUN: 24 mg/dL — AB (ref 6–20)
CREATININE: 1.3 mg/dL — AB (ref 0.44–1.00)
Calcium, Ion: 1.19 mmol/L (ref 1.13–1.30)
Chloride: 103 mmol/L (ref 101–111)
GLUCOSE: 113 mg/dL — AB (ref 65–99)
HCT: 36 % (ref 36.0–46.0)
Hemoglobin: 12.2 g/dL (ref 12.0–15.0)
POTASSIUM: 3.9 mmol/L (ref 3.5–5.1)
SODIUM: 142 mmol/L (ref 135–145)
TCO2: 25 mmol/L (ref 0–100)

## 2015-05-17 LAB — DIFFERENTIAL
Basophils Absolute: 0 10*3/uL (ref 0.0–0.1)
Basophils Relative: 0 % (ref 0–1)
Eosinophils Absolute: 0.1 10*3/uL (ref 0.0–0.7)
Eosinophils Relative: 5 % (ref 0–5)
LYMPHS ABS: 0.9 10*3/uL (ref 0.7–4.0)
LYMPHS PCT: 34 % (ref 12–46)
MONOS PCT: 13 % — AB (ref 3–12)
Monocytes Absolute: 0.4 10*3/uL (ref 0.1–1.0)
NEUTROS ABS: 1.3 10*3/uL — AB (ref 1.7–7.7)
NEUTROS PCT: 48 % (ref 43–77)

## 2015-05-17 LAB — CBG MONITORING, ED: Glucose-Capillary: 116 mg/dL — ABNORMAL HIGH (ref 65–99)

## 2015-05-17 LAB — MRSA PCR SCREENING: MRSA BY PCR: NEGATIVE

## 2015-05-17 LAB — APTT: APTT: 32 s (ref 24–37)

## 2015-05-17 LAB — PROTIME-INR
INR: 1.17 (ref 0.00–1.49)
PROTHROMBIN TIME: 15 s (ref 11.6–15.2)

## 2015-05-17 LAB — ETHANOL: Alcohol, Ethyl (B): <5 mg/dL (ref <5)

## 2015-05-17 MED ORDER — FUROSEMIDE 20 MG PO TABS
20.0000 mg | ORAL_TABLET | Freq: Every day | ORAL | Status: DC
  Filled 2015-05-17: qty 1

## 2015-05-17 MED ORDER — LEVOTHYROXINE SODIUM 25 MCG PO TABS
25.0000 ug | ORAL_TABLET | Freq: Every day | ORAL | Status: DC
  Filled 2015-05-17: qty 1

## 2015-05-17 MED ORDER — LABETALOL HCL 5 MG/ML IV SOLN
10.0000 mg | INTRAVENOUS | Status: DC | PRN
  Administered 2015-05-17: 20 mg via INTRAVENOUS
  Filled 2015-05-17: qty 4

## 2015-05-17 MED ORDER — PANTOPRAZOLE SODIUM 40 MG PO TBEC: 40.0000 mg | DELAYED_RELEASE_TABLET | Freq: Every day | ORAL | Status: DC

## 2015-05-17 MED ORDER — ACETAMINOPHEN 325 MG PO TABS: 650.0000 mg | ORAL_TABLET | ORAL | Status: DC | PRN

## 2015-05-17 MED ORDER — LISINOPRIL 40 MG PO TABS
40.0000 mg | ORAL_TABLET | Freq: Every day | ORAL | Status: DC
  Filled 2015-05-17: qty 1

## 2015-05-17 MED ORDER — LEVOTHYROXINE SODIUM 100 MCG IV SOLR
12.5000 ug | Freq: Every day | INTRAVENOUS | Status: DC
  Administered 2015-05-18: 12.5 ug via INTRAVENOUS
  Filled 2015-05-17 (×2): qty 5

## 2015-05-17 MED ORDER — SODIUM CHLORIDE 0.9 % IV SOLN
INTRAVENOUS | Status: DC
  Administered 2015-05-17: 75 mL/h via INTRAVENOUS

## 2015-05-17 MED ORDER — PANTOPRAZOLE SODIUM 40 MG IV SOLR
40.0000 mg | Freq: Every day | INTRAVENOUS | Status: DC
  Administered 2015-05-17: 40 mg via INTRAVENOUS
  Filled 2015-05-17: qty 40

## 2015-05-17 MED ORDER — ATORVASTATIN CALCIUM 10 MG PO TABS
10.0000 mg | ORAL_TABLET | Freq: Every day | ORAL | Status: DC
  Filled 2015-05-17 (×2): qty 1

## 2015-05-17 MED ORDER — STROKE: EARLY STAGES OF RECOVERY BOOK
Freq: Once | Status: DC
  Filled 2015-05-17: qty 1

## 2015-05-17 MED ORDER — ACETAMINOPHEN 650 MG RE SUPP: 650.0000 mg | RECTAL | Status: DC | PRN

## 2015-05-17 MED ORDER — SENNOSIDES-DOCUSATE SODIUM 8.6-50 MG PO TABS
1.0000 | ORAL_TABLET | Freq: Two times a day (BID) | ORAL | Status: DC
  Filled 2015-05-17 (×3): qty 1

## 2015-05-17 NOTE — ED Notes (Signed)
Pt brought in by son c/o right sided weakness with LSN at 0850; pt noted to have facial droop and right sided flaccid at present; code stroke activated and pt taken to room; pt woke up at 0700 and was normal

## 2015-05-17 NOTE — Progress Notes (Signed)
Contacted Neuro PA about pt's code status. ED RN had stated that the family would intubate if it wasn't going to be a long term thing. Family had discussed at bed side that they would like full code measures. Epic stated DNR. PA sated to leave DNR status and Neuro Md would address on morning of 8/12.

## 2015-05-17 NOTE — Code Documentation (Signed)
81 year old female presents to [facility] via pvt vehicle with right side weakness.  Family reports she awakened this AM and all seemed well - her daughter spoke with her speech little slurred but not unusal first thing in AM - patient ambulated independently to BR where she dressed herself.  She then walked down the steps independently and went outside to porch,.  She walked to the breakfast table while eating she became weak on the right side.  She was then brought to ED per family. Family reports history of left brain mass x 2 years.  NIHSS 11.  Taken to CT scan - left brain hemorrhage on CT.  Dr. Nicole Kindred present - speaking with family.  BP 101/57 HR 45-52 SR NS bolus started prior to CT - family states she has low HR.  HOB elevated - protecting airway - has strong cough.  Handoff to [employer].

## 2015-05-17 NOTE — ED Provider Notes (Signed)
CSN: [id number]     Arrival date & time 05/17/15  1045 History   First MD Initiated Contact with Patient 05/17/15 1103     Chief Complaint  Patient presents with  . Code Stroke   LEVEL 5 CAVEAT APPLIES SECONDARY TO DEMENTIA AND AMS  (Consider location/radiation/quality/duration/timing/severity/associated sxs/prior Treatment) HPI Comments: 81 year old female with a history of hypertension, dyslipidemia, dementia and memory loss, and left thalamic mass (seen on MR in May 2015; non-operative management pursued) presents to the ED as a code stroke. Daughter reports that patient awoke at 0830. Daughter recalls patient's speech sounding "garbled" at this time, but this was assumed to be because the patient's dentures were not in place. Patient was able to dress herself this morning and was ambulating in the home without difficulty. Daughter states that the patient's son noticed some changes in her face with R sided drooping a bit before 9AM. Patient was also having difficulty feeding herself, getting her spoon to her mouth, which is not per her usual baseline. Patient brought to ED for evaluation at this time. Patient has no c/o pain or headache.  Patient on an aspirin regimen; no other anticoagulation. She has been followed by [facility] for just over 1 year for cognitive decline and a thalamic mass of unknown etiology.  The history is provided by a relative and medical records. No language interpreter was used.    Past Medical History  Diagnosis Date  . Breast cancer   . Hypertension   . Hyperlipidemia   . GERD (gastroesophageal reflux disease)   . Diverticulosis   . Cholelithiasis   . Gout   . Osteoporosis   . Anemia   . Depression   . Hemorrhoids   . Insomnia   . Hypothyroidism   . Hiatal hernia   . Breast cancer 2000  . Chronic constipation   . Unspecified urinary incontinence   . Edema   . Encounter for long-term (current) use of other medications   . Unspecified venous  (peripheral) insufficiency   . Nonspecific abnormal results of liver function study   . Gout, unspecified   . Memory loss   . Disorders of bilirubin excretion   . Neutropenia, unspecified   . Anemia, unspecified   . Other specified cardiac dysrhythmias(427.89)   . Hypopotassemia   . Disorder of bone and cartilage, unspecified   . Postmastectomy lymphedema syndrome   . Diverticulitis of colon (without mention of hemorrhage)    Past Surgical History  Procedure Laterality Date  . Mastectomy  2008    left  . Vaginal hysterectomy    . Fibroid tumor removal    . Abdominal hysterectomy     Family History  Problem Relation Age of Onset  . Lung cancer Brother     2  . Colon cancer Sister     2 sisters dx at age 80 and 76  . Heart disease Mother   . Kidney disease Brother   . Heart disease Father   . Heart disease Sister   . Cirrhosis Brother    Social History  Substance Use Topics  . Smoking status: Never Smoker   . Smokeless tobacco: Never Used  . Alcohol Use: No   OB History    No data available      Review of Systems  Unable to perform ROS: Mental status change    Allergies  Review of patient's allergies indicates no known allergies.  Home Medications   Prior to Admission medications   Medication  Sig Start Date End Date Taking? Authorizing Provider  aspirin 81 MG tablet Take 81 mg by mouth daily.      Historical Provider, MD  atorvastatin (LIPITOR) 10 MG tablet TAKE ONE TABLET BY MOUTH ONCE DAILY TO  LOWER  CHOLESTEROL 03/23/15   Gildardo Cranker, DO  furosemide (LASIX) 20 MG tablet Take 1 tablet (20 mg total) by mouth daily. 01/24/15   Gildardo Cranker, DO  levothyroxine (SYNTHROID, LEVOTHROID) 25 MCG tablet Take 1/2 tablet by mouth daily before breakfast. 01/24/15   Gildardo Cranker, DO  lisinopril (PRINIVIL,ZESTRIL) 40 MG tablet Take 1 tablet (40 mg total) by mouth daily. 01/24/15   Gildardo Cranker, DO  Multiple Vitamin (MULTIVITAMIN) tablet Take 1 tablet by mouth daily.       Historical Provider, MD  pantoprazole (PROTONIX) 40 MG tablet Take 1 tablet (40 mg total) by mouth daily. 04/03/15   Gildardo Cranker, DO  POTASSIUM PO Take 1 tablet by mouth daily. "over the counter potassium supplement"    Historical Provider, MD   BP 135/47 mmHg  Pulse 45  Temp(Src) 99.2 F (37.3 C) (Oral)  Resp 27  Wt 106 lb 14.8 oz (48.501 kg)  SpO2 100%   Physical Exam  Constitutional: She is oriented to person, place, and time. She appears well-developed and well-nourished. No distress.  Nontoxic appearing; calm  HENT:  Head: Normocephalic and atraumatic.  Eyes: Conjunctivae and EOM are normal. No scleral icterus.  Symmetric eyebrow raise. No nystagmus noted.  Neck: Normal range of motion.  Normal ROM exhibited  Cardiovascular: Regular rhythm and intact distal pulses.  Bradycardia present.   Pulmonary/Chest: Effort normal. No respiratory distress. She has no wheezes.  Respirations even and unlabored.  Musculoskeletal: She exhibits no edema.  Neurological: She is alert and oriented to person, place, and time.  Patient alert and following commands; answering questions appropriately. Facial drooping noted on the right side. RUE strength 3/5; RLE 3/5. Patient seen moving L sided without difficulty; able to partially undress with nursing following arrival.  Skin: Skin is warm and dry. No rash noted. She is not diaphoretic. No erythema. No pallor.  Psychiatric: She has a normal mood and affect. Her behavior is normal.  Nursing note and vitals reviewed.   ED Course  Procedures (including critical care time) Labs Review Labs Reviewed  CBC - Abnormal; Notable for the following:    WBC 2.6 (*)    Hemoglobin 11.4 (*)    HCT 34.4 (*)    All other components within normal limits  DIFFERENTIAL - Abnormal; Notable for the following:    Neutro Abs 1.3 (*)    Monocytes Relative 13 (*)    All other components within normal limits  COMPREHENSIVE METABOLIC PANEL - Abnormal; Notable for  the following:    Glucose, Bld 121 (*)    Creatinine, Ser 1.28 (*)    Calcium 8.7 (*)    Total Protein 5.5 (*)    Albumin 3.1 (*)    GFR calc non Af Amer 35 (*)    GFR calc Af Amer 41 (*)    Anion gap 4 (*)    All other components within normal limits  I-STAT CHEM 8, ED - Abnormal; Notable for the following:    BUN 24 (*)    Creatinine, Ser 1.30 (*)    Glucose, Bld 113 (*)    All other components within normal limits  CBG MONITORING, ED - Abnormal; Notable for the following:    Glucose-Capillary 116 (*)  All other components within normal limits  ETHANOL  PROTIME-INR  APTT  URINE RAPID DRUG SCREEN, HOSP PERFORMED  URINALYSIS, ROUTINE W REFLEX MICROSCOPIC (NOT AT [facility])  I-STAT TROPOININ, ED    Imaging Review Ct Head Wo Contrast  05/17/2015   CLINICAL DATA:  Right-sided weakness.  Facial droop.  Code stroke.  EXAM: CT HEAD WITHOUT CONTRAST  TECHNIQUE: Contiguous axial images were obtained from the base of the skull through the vertex without intravenous contrast.  COMPARISON:  Head MRI 02/06/2014 and CT 02/04/2014  FINDINGS: There is a 1.5 cm focus of acute hemorrhage involving the anterior left thalamus at the site of the mass described on the prior studies. There is mild surrounding edema. The scratched there is mild local mass effect with 4 mm of rightward midline shift at the level of the thalami. No intraventricular hemorrhage is seen. The left thalamus overall appears enlarged with additional more focal hypodensity involving its posterior aspect corresponding to the abnormal T2 hyperintensity on the prior brain MRI.  Previously described mass along the posterior planum sphenoidale is not well demonstrated by CT. There is no evidence of acute large territory infarct or extra-axial fluid collection. Mild cerebral atrophy is within normal limits for age.  Prior bilateral cataract extraction is noted. The visualized paranasal sinuses and mastoid air cells are clear. Calcified  atherosclerosis is noted at the skull base.  IMPRESSION: 1.5 cm acute hemorrhage in the left thalamus at the site of the previously described mass. Mild surrounding edema with slight midline shift.  Critical Value/emergent results were called by telephone at the time of interpretation on 05/17/2015 at 11:43 am to Dr. Armida Sans, who verbally acknowledged these results.   Electronically Signed   By: Logan Bores   On: 05/17/2015 11:43     EKG Interpretation   Date/Time:  Thursday May 17 2015 10:59:50 EDT Ventricular Rate:  45 PR Interval:  186 QRS Duration: 83 QT Interval:  468 QTC Calculation: 405 R Axis:   44 Text Interpretation:  Sinus bradycardia Borderline low voltage, extremity  leads Consider left ventricular hypertrophy Anterior Q waves, possibly due  to LVH ST elevation, consider inferior injury No significant change since  last tracing Confirmed by LITTLE MD, RACHEL ([id number]) on 05/17/2015 11:03:39  AM       CRITICAL CARE Performed by: Antonietta Breach   Total critical care time: 35  Critical care time was exclusive of separately billable procedures and treating other patients.  Critical care was necessary to treat or prevent imminent or life-threatening deterioration.  Critical care was time spent personally by me on the following activities: development of treatment plan with patient and/or surrogate as well as nursing, discussions with consultants, evaluation of patient's response to treatment, examination of patient, obtaining history from patient or surrogate, ordering and performing treatments and interventions, ordering and review of laboratory studies, ordering and review of radiographic studies, pulse oximetry and re-evaluation of patient's condition.  MDM   Final diagnoses:  Hemorrhagic stroke    81 year old female presents to the emergency department as a code stroke. Last seen normal was suspected to be just before 9AM, per family. Patient with a history of a left  thalamic mass for which she has been seen by Neurology on an outpatient basis for 1 year. She was found to have a hemorrhagic left-sided stroke around the area of this mass on CT today. Neurology to admit for management; VSS at this time.   Filed Vitals:   05/17/15 1100 05/17/15 1115 05/17/15  1124 05/17/15 1130  BP: 109/45 101/77  135/47  Pulse: 44   45  Temp: 99.2 F (37.3 C)     TempSrc: Oral     Resp: 18   27  Weight:   106 lb 14.8 oz (48.501 kg)   SpO2: 100%   100%     Antonietta Breach, PA-C 05/17/15 [place], MD 05/17/15 1226

## 2015-05-17 NOTE — ED Notes (Signed)
Pt placed in gown and in bed. Monitored by pulse ox, bp cuff, and 12-lead.

## 2015-05-17 NOTE — ED Notes (Signed)
PT brought by family for R sided weakness since 0830.  Pt with hx of L sided brain tumor and dementia appeared her norm when she came down the stairs this am.  It wasn't until she ate breakfast that family noticed R sided facial droop.

## 2015-05-17 NOTE — ED Notes (Signed)
Attempted report.  RN en-route to floor from MRI.

## 2015-05-17 NOTE — ED Notes (Signed)
CBG = 116  PA-C Humes informed of result.

## 2015-05-17 NOTE — H&P (Signed)
H&P    Chief Complaint: ICH  HPI:                                                                                                                                         Kerry Wells is an 81 y.o. female with known left thalamic mass followed by [facility].  Patient and family decided to not treat mass in the past. She has known advanced dementia. Patient awoke normal today and at 0830 was noted to have unsteady gait and weakness on the right side. [facility] brought to [facility] as a code stroke. CT head revealed a left thalamic hemorrhage involving the area of previously described mass lesion.  tPA was was not administered. BP was well controlled while in ED. NIH stroke score was 11. Patient has been on aspirin, but no anticoagulation.   Date last known well: Date: 05/17/2015 Time last known well: Time: 08:30 tPA Given: No: ICH Modified Rankin: Rankin Score=4    Past Medical History  Diagnosis Date  . Breast cancer   . Hypertension   . Hyperlipidemia   . GERD (gastroesophageal reflux disease)   . Diverticulosis   . Cholelithiasis   . Gout   . Osteoporosis   . Anemia   . Depression   . Hemorrhoids   . Insomnia   . Hypothyroidism   . Hiatal hernia   . Breast cancer 2000  . Chronic constipation   . Unspecified urinary incontinence   . Edema   . Encounter for long-term (current) use of other medications   . Unspecified venous (peripheral) insufficiency   . Nonspecific abnormal results of liver function study   . Gout, unspecified   . Memory loss   . Disorders of bilirubin excretion   . Neutropenia, unspecified   . Anemia, unspecified   . Other specified cardiac dysrhythmias(427.89)   . Hypopotassemia   . Disorder of bone and cartilage, unspecified   . Postmastectomy lymphedema syndrome   . Diverticulitis of colon (without mention of hemorrhage)     Past Surgical History  Procedure Laterality Date  . Mastectomy  2008    left  . Vaginal hysterectomy    .  Fibroid tumor removal    . Abdominal hysterectomy      Family History  Problem Relation Age of Onset  . Lung cancer Brother     2  . Colon cancer Sister     2 sisters dx at age 53 and 41  . Heart disease Mother   . Kidney disease Brother   . Heart disease Father   . Heart disease Sister   . Cirrhosis Brother    Social History:  reports that she has never smoked. She has never used smokeless tobacco. She reports that she does not drink alcohol or use illicit drugs.  Allergies: No Known Allergies  Medications:  Current Facility-Administered Medications  Medication Dose Route Frequency Provider Last Rate Last Dose  . atorvastatin (LIPITOR) tablet 10 mg  10 mg Oral q1800 Marliss Coots, PA-C      . furosemide (LASIX) tablet 20 mg  20 mg Oral Daily Marliss Coots, PA-C      . [START ON 05/18/2015] levothyroxine (SYNTHROID, LEVOTHROID) tablet 25 mcg  25 mcg Oral QAC breakfast Marliss Coots, PA-C      . lisinopril (PRINIVIL,ZESTRIL) tablet 40 mg  40 mg Oral Daily Marliss Coots, PA-C      . pantoprazole (PROTONIX) EC tablet 40 mg  40 mg Oral Daily Marliss Coots, PA-C       Current Outpatient Prescriptions  Medication Sig Dispense Refill  . aspirin 81 MG tablet Take 81 mg by mouth daily.      Marland Kitchen atorvastatin (LIPITOR) 10 MG tablet TAKE ONE TABLET BY MOUTH ONCE DAILY TO  LOWER  CHOLESTEROL 30 tablet 2  . furosemide (LASIX) 20 MG tablet Take 1 tablet (20 mg total) by mouth daily. 30 tablet 6  . levothyroxine (SYNTHROID, LEVOTHROID) 25 MCG tablet Take 1/2 tablet by mouth daily before breakfast. 15 tablet 4  . lisinopril (PRINIVIL,ZESTRIL) 40 MG tablet Take 1 tablet (40 mg total) by mouth daily. 30 tablet 6  . Multiple Vitamin (MULTIVITAMIN) tablet Take 1 tablet by mouth daily.      . pantoprazole (PROTONIX) 40 MG tablet Take 1 tablet (40 mg total) by mouth daily. 30 tablet 5   . POTASSIUM PO Take 1 tablet by mouth daily. "over the counter potassium supplement"       ROS:                                                                                                                                       History obtained from daughter  General ROS: negative for - chills, fatigue, fever, night sweats, weight gain or weight loss Psychological ROS: Positive for progressive climb in cognitive functioning consistent with chronic dementia Ophthalmic ROS: negative for - blurry vision, double vision, eye pain or loss of vision ENT ROS: negative for - epistaxis, nasal discharge, oral lesions, sore throat, tinnitus or vertigo Allergy and Immunology ROS: negative for - hives or itchy/watery eyes Hematological and Lymphatic ROS: negative for - bleeding problems, bruising or swollen lymph nodes Endocrine ROS: negative for - galactorrhea, hair pattern changes, polydipsia/polyuria or temperature intolerance Respiratory ROS: negative for - cough, hemoptysis, shortness of breath or wheezing Cardiovascular ROS: negative for - chest pain, dyspnea on exertion, edema or irregular heartbeat Gastrointestinal ROS: negative for - abdominal pain, diarrhea, hematemesis, nausea/vomiting or stool incontinence Genito-Urinary ROS: negative for - dysuria, hematuria, incontinence or urinary frequency/urgency Musculoskeletal ROS: negative for - joint swelling or muscular weakness Neurological ROS: as noted in HPI Dermatological ROS: negative for rash and skin lesion changes  Neurologic Examination:  Blood pressure 119/46, pulse 45, temperature 99.2 F (37.3 C), temperature source Oral, resp. rate 18, SpO2 100 %.  HEENT-  Normocephalic, no lesions, without obvious abnormality.  Normal external eye and conjunctiva.  Normal TM's bilaterally.  Normal auditory canals and external ears. Normal external  nose, mucus membranes and septum.  Normal pharynx. Cardiovascular- S1, S2 normal, pulses palpable throughout   Lungs- chest clear, no wheezing, rales, normal symmetric air entry Abdomen- normal findings: bowel sounds normal Extremities- no edema Lymph-no adenopathy palpable Musculoskeletal-no joint tenderness, deformity or swelling Skin-warm and dry, no hyperpigmentation, vitiligo, or suspicious lesions  Neurological Examination Mental Status: Alert, able to recognize her daughter and objects.Marland Kitchen  Speech fluent without evidence of aphasia.  Able to follow simple one step commands. Cranial Nerves: II: Discs flat bilaterally; Visual fields grossly normal, pupils equal, round, reactive to light and accommodation III,IV, VI: ptosis not present, extra-ocular motions intact bilaterally V,VII: smile symmetric with right NL fold decrease at rest, facial light touch sensation normal bilaterally VIII: hearing normal bilaterally IX,X: uvula rises symmetrically XI: bilateral shoulder shrug XII: midline tongue extension Motor: Right : Upper extremity   3/5    Left:     Upper extremity   4/5  Lower extremity   0/5     Lower extremity   3/5 Tone and bulk:normal tone throughout; no atrophy noted Sensory: Pinprick and light touch intact throughout, bilaterally Deep Tendon Reflexes: 1+ and symmetric throughout Plantars: Right: downgoing   Left: downgoing Cerebellar: normal finger-to-nose on the left unable to obtain on the right. Unable to obtain H-S bilaterally Gait: not assessed       Lab Results: Basic Metabolic Panel:  Recent Labs Lab 05/17/15 1110  NA 142  K 3.9  CL 103  GLUCOSE 113*  BUN 24*  CREATININE 1.30*    Liver Function Tests: No results for input(s): AST, ALT, ALKPHOS, BILITOT, PROT, ALBUMIN in the last 168 hours. No results for input(s): LIPASE, AMYLASE in the last 168 hours. No results for input(s): AMMONIA in the last 168 hours.  CBC:  Recent Labs Lab  05/17/15 1100 05/17/15 1110  WBC 2.6*  --   NEUTROABS 1.3*  --   HGB 11.4* 12.2  HCT 34.4* 36.0  MCV 87.5  --   PLT 169  --     Cardiac Enzymes: No results for input(s): CKTOTAL, CKMB, CKMBINDEX, TROPONINI in the last 168 hours.  Lipid Panel: No results for input(s): CHOL, TRIG, HDL, CHOLHDL, VLDL, LDLCALC in the last 168 hours.  CBG:  Recent Labs Lab 05/17/15 1058  GLUCAP 116*    Microbiology: Results for orders placed or performed during the hospital encounter of 11/21/14  Urine culture     Status: None   Collection Time: 11/21/14 12:20 PM  Result Value Ref Range Status   Specimen Description URINE, CLEAN CATCH  Final   Special Requests none Normal  Final   Colony Count NO GROWTH Performed at [facility]   Final   Culture NO GROWTH Performed at [facility]   Final   Report Status 11/22/2014 FINAL  Final    Coagulation Studies:  Recent Labs  05/17/15 1100  LABPROT 15.0  INR 1.17    Imaging: No results found.     Assessment and plan discussed with with attending physician and they are in agreement.    Etta Quill PA-C [facility] [phone]  05/17/2015, 11:28 AM   Assessment: 81 y.o. female with acute left thalamic hemorrhage into an existing  known mass lesion.   Stroke Risk Factors - hyperlipidemia and hypertension  Recommend: 1. HgbA1c, fasting lipid panel 2. MRI, MRA  of the brain without contrast 3. PT consult, OT consult, Speech consult 4. Echocardiogram 5. Carotid dopplers 6. Prophylactic therapy-None 7. Risk factor modification 8. Telemetry monitoring 9. Frequent neuro checks 10 NPO until passes stroke swallow screen 11 admit to ICU 12 BP goal <160/90  This patient is critically ill and at significant risk of neurological worsening or death, and care requires constant monitoring of vital signs, hemodynamics,respiratory and cardiac monitoring, neurological assessment, discussion with family, other  specialists and medical decision making of high complexity. Total critical care time was 60 minutes.  I personally participated in this patient's evaluation and management, including clinical examination, as well as formulating the above clinical impression and management recommendations.  Rush Farmer M.D. [facility] [phone]

## 2015-05-17 NOTE — ED Notes (Signed)
Airway cleared by Md

## 2015-05-18 ENCOUNTER — Inpatient Hospital Stay (HOSPITAL_COMMUNITY): Payer: Medicare Other

## 2015-05-18 ENCOUNTER — Encounter (HOSPITAL_COMMUNITY): Payer: Self-pay | Admitting: Radiology

## 2015-05-18 DIAGNOSIS — G936 Cerebral edema: Secondary | ICD-10-CM

## 2015-05-18 DIAGNOSIS — C7931 Secondary malignant neoplasm of brain: Secondary | ICD-10-CM

## 2015-05-18 LAB — GLUCOSE, CAPILLARY
GLUCOSE-CAPILLARY: 79 mg/dL (ref 65–99)
GLUCOSE-CAPILLARY: 85 mg/dL (ref 65–99)

## 2015-05-18 MED ORDER — IOHEXOL 300 MG/ML  SOLN
80.0000 mL | Freq: Once | INTRAMUSCULAR | Status: AC | PRN
  Administered 2015-05-18: 80 mL via INTRAVENOUS

## 2015-05-18 MED ORDER — GLYCOPYRROLATE 0.2 MG/ML IJ SOLN
0.2000 mg | INTRAMUSCULAR | Status: DC | PRN
  Filled 2015-05-18: qty 1

## 2015-05-18 MED ORDER — MORPHINE SULFATE 2 MG/ML IJ SOLN: 1.0000 mg | INTRAMUSCULAR | Status: DC | PRN

## 2015-05-18 MED ORDER — LORAZEPAM 2 MG/ML IJ SOLN: 0.5000 mg | INTRAMUSCULAR | Status: DC | PRN

## 2015-05-18 NOTE — Care Management Note (Signed)
Case Management Note  Patient Details  Name: Kerry Wells MRN: [id number] Date of Birth: [birth year]  Subjective/Objective:     Pt admitted on 05/17/15 with Lt thalamic hemorrhage.  PTA, pt resides at home with family.                 Action/Plan: Will follow for discharge planning.  Pt will need PT/OT consults when able to tolerate.    Expected Discharge Date:                  Expected Discharge Plan:  Skilled Nursing Facility  In-House Referral:  Clinical Social Work  Discharge planning Services  CM Consult  Post Acute Care Choice:    Choice offered to:     DME Arranged:    DME Agency:     HH Arranged:    [place] Agency:     Status of Service:  In process, will continue to follow  Medicare Important Message Given:    Date Medicare IM Given:    Medicare IM give by:    Date Additional Medicare IM Given:    Additional Medicare Important Message give by:     If discussed at [place] of Stay Meetings, dates discussed:    Additional Comments:  Reinaldo Raddle, RN, BSN  Trauma/Neuro ICU Case Manager [phone]

## 2015-05-18 NOTE — Progress Notes (Signed)
PT Cancellation Note  Patient Details Name: KENDALL ARNELL MRN: [id number] DOB: 1920-03-11   Cancelled Treatment:    Reason Eval/Treat Not Completed: Patient not medically ready; patient on bedrest, RN reports not ready for PT yet.  Will attempt to see tomorrow.   WYNN,CYNDI 05/18/2015, 10:19 AM  Magda Kiel, PT [phone] 05/18/2015

## 2015-05-18 NOTE — Plan of Care (Signed)
Problem: Consults Goal: Intracranial Hemorrhage Patient Education See Patient Education Module for education specifics. Outcome: Completed/Met Date Met:  05/18/15 Nurse and physician met with family

## 2015-05-18 NOTE — Consult Note (Signed)
Consultation Note Date: 05/18/2015   Patient Name: Kerry Wells  DOB: 1920/09/16  MRN: [id number]  Age / Sex: 81 y.o., female   PCP: Kerry Cranker, DO Referring Physician: Garvin Fila, MD  Reason for Consultation: Establishing goals of care. Hospice in the home or in-pateint  Palliative Care Assessment and Plan Summary of Established Goals of Care and Medical Treatment Preferences   Clinical Assessment/Narrative: Pt is a 81 yo female with advanced dementia with known left thalamic mass. Family decided to not treat mass and pt is followed by [facility] neurology. She lives with her daughter Kerry Wells. Pt on 05-17-15 awakened at 0830 and family noted unsteady gait and right-sided weakness. She was brought to [facility] in Code Stroke. Head CT revealed left thalamic hemorrhage in area known for mass. Pt was not a candidate for tPA. Her repeat Head CT today shows interval increase in area of hemorrhage from 1.5 cm to 3.0 x 3.5 cm with vasogenic edema. There was some initial confusion as to her code status but per RN at bedside Dr. Leonie Wells held family meeting this am and all now reportedly in agreement with DNR. Pt does not awaken to voice. She attempts to withdraw to light touch  Contacts/Participants in Discussion: Primary Decision Maker: Daughter Kerry Wells but in consultation with her siblings who have been included in plan HCPOA: Not clear if there is one designee Dr. Leonie Wells met with Kerry Wells and Kerry Wells This evening I met with Kerry Wells and his wife, Kerry Wells. Brother Kerry Wells lives in [place] and per siblings is aware on mother's clinical status   Code Status/Advance Care Planning:  DNR  Comfort care  Transfer out of ICU to [place] placed for in-patient hospice care  Symptom Management:   No current s/s of respiratory distress or pain  Seizure prophylaxis: Pt high risk for seizure with new hemorrhage and associated edema. Pt on seizure precautions  Palliative Prophylaxis: senna in place  for constipation but pt unable to swallow. Will order dulcolax supp  Comfort medicines left in place: ms04 iv prn, ativan prn, robinul prn  Additional Recommendations (Limitations, Scope, Preferences):  See above Psycho-social/Spiritual:   Support System: yes  Desire for further Chaplaincy support:no  Prognosis: < 2 weeks  Discharge Planning:  Hospice facility       Chief Complaint/History of Present Illness: Pt is a 81 yo female admitted after awakening on 05-17-15 with gait abnormality and right sided weakness. Code stroke was called. She was found to have area of hemorrhage in left thalamic area that is enhancing.  Primary Diagnoses  Present on Admission:  . ICH (intracerebral hemorrhage)  Palliative Review of Systems: Pt is non-verbal I have reviewed the medical record, interviewed the patient and family, and examined the patient. The following aspects are pertinent.  Past Medical History  Diagnosis Date  . Breast cancer   . Hypertension   . Hyperlipidemia   . GERD (gastroesophageal reflux disease)   . Diverticulosis   . Cholelithiasis   . Gout   . Osteoporosis   . Anemia   . Depression   . Hemorrhoids   . Insomnia   . Hypothyroidism   . Hiatal hernia   . Breast cancer 2000  . Chronic constipation   . Unspecified urinary incontinence   . Edema   . Encounter for long-term (current) use of other medications   . Unspecified venous (peripheral) insufficiency   . Nonspecific abnormal results of liver function study   . Gout, unspecified   .  Memory loss   . Disorders of bilirubin excretion   . Neutropenia, unspecified   . Anemia, unspecified   . Other specified cardiac dysrhythmias(427.89)   . Hypopotassemia   . Disorder of bone and cartilage, unspecified   . Postmastectomy lymphedema syndrome   . Diverticulitis of colon (without mention of hemorrhage)    Social History   Social History  . Marital Status: Widowed    Spouse Name: N/A  . Number of  Children: 6  . Years of Education: N/A   Social History Main Topics  . Smoking status: Never Smoker   . Smokeless tobacco: Never Used  . Alcohol Use: No  . Drug Use: No  . Sexual Activity: Not Asked   Other Topics Concern  . None   Social History Narrative   Patient lives at home with daughter and son.    Patient has 6 children   Patient is widowed    Patient has a high school education    Patient is retired       Family History  Problem Relation Age of Onset  . Lung cancer Brother     2  . Colon cancer Sister     2 sisters dx at age 43 and 68  . Heart disease Mother   . Kidney disease Brother   . Heart disease Father   . Heart disease Sister   . Cirrhosis Brother    Scheduled Meds: .  stroke: mapping our early stages of recovery book   Does not apply Once  . atorvastatin  10 mg Oral q1800  . furosemide  20 mg Oral Daily  . levothyroxine  12.5 mcg Intravenous Daily  . lisinopril  40 mg Oral Daily  . pantoprazole (PROTONIX) IV  40 mg Intravenous QHS  . senna-docusate  1 tablet Oral BID   Continuous Infusions: . sodium chloride 75 mL/hr at 05/17/15 1600   PRN Meds:.acetaminophen **OR** acetaminophen, labetalol Medications Prior to Admission:  Prior to Admission medications   Medication Sig Start Date End Date Taking? Authorizing Provider  aspirin 81 MG tablet Take 81 mg by mouth daily.     Yes Historical Provider, MD  atorvastatin (LIPITOR) 10 MG tablet TAKE ONE TABLET BY MOUTH ONCE DAILY TO  LOWER  CHOLESTEROL Patient taking differently: TAKE ONE TABLET BY MOUTH every evening TO  LOWER  CHOLESTEROL 03/23/15  Yes Kerry Cranker, DO  furosemide (LASIX) 20 MG tablet Take 1 tablet (20 mg total) by mouth daily. 01/24/15  Yes Kerry Cranker, DO  levothyroxine (SYNTHROID, LEVOTHROID) 25 MCG tablet Take 1/2 tablet by mouth daily before breakfast. 01/24/15  Yes Kerry Cranker, DO  lisinopril (PRINIVIL,ZESTRIL) 40 MG tablet Take 1 tablet (40 mg total) by mouth daily. 01/24/15  Yes  Kerry Cranker, DO  Multiple Vitamin (MULTIVITAMIN) tablet Take 1 tablet by mouth daily.     Yes Historical Provider, MD  pantoprazole (PROTONIX) 40 MG tablet Take 1 tablet (40 mg total) by mouth daily. Patient taking differently: Take 40 mg by mouth at bedtime.  04/03/15  Yes Kerry Cranker, DO  POTASSIUM PO Take 1 tablet by mouth daily. "over the counter potassium supplement"   Yes Historical Provider, MD   No Known Allergies CBC:    Component Value Date/Time   WBC 2.6* 05/17/2015 1100   WBC 2.7* 02/24/2013 1017   WBC 3.6* 10/26/2009 1408   HGB 12.2 05/17/2015 1110   HGB 13.1 10/26/2009 1408   HCT 36.0 05/17/2015 1110   HCT 40.6 10/26/2009 1408  PLT 169 05/17/2015 1100   PLT 205 10/26/2009 1408   MCV 87.5 05/17/2015 1100   MCV 84.2 10/26/2009 1408   NEUTROABS 1.3* 05/17/2015 1100   NEUTROABS 1.3* 02/24/2013 1017   NEUTROABS 1.8 10/26/2009 1408   LYMPHSABS 0.9 05/17/2015 1100   LYMPHSABS 1.0 02/24/2013 1017   LYMPHSABS 1.2 10/26/2009 1408   MONOABS 0.4 05/17/2015 1100   MONOABS 0.5 10/26/2009 1408   EOSABS 0.1 05/17/2015 1100   EOSABS 0.1 02/24/2013 1017   BASOSABS 0.0 05/17/2015 1100   BASOSABS 0.0 02/24/2013 1017   BASOSABS 0.0 10/26/2009 1408   Comprehensive Metabolic Panel:    Component Value Date/Time   NA 142 05/17/2015 1110   NA 143 04/16/2015 0823   K 3.9 05/17/2015 1110   CL 103 05/17/2015 1110   CO2 27 05/17/2015 1100   BUN 24* 05/17/2015 1110   BUN 17 04/16/2015 0823   CREATININE 1.30* 05/17/2015 1110   GLUCOSE 113* 05/17/2015 1110   GLUCOSE 77 04/16/2015 0823   CALCIUM 8.7* 05/17/2015 1100   AST 26 05/17/2015 1100   ALT 25 05/17/2015 1100   ALKPHOS 61 05/17/2015 1100   BILITOT 1.1 05/17/2015 1100   BILITOT 1.3* 04/16/2015 0823   PROT 5.5* 05/17/2015 1100   PROT 5.4* 04/16/2015 0823   ALBUMIN 3.1* 05/17/2015 1100    Physical Exam: Vital Signs: BP 159/49 mmHg  Pulse 52  Temp(Src) 98.2 F (36.8 C) (Oral)  Resp 15  Ht _0  (1.6 m)  Wt 46.5 kg  (102 lb 8.2 oz)  BMI 18.16 kg/m2  SpO2 100% SpO2: SpO2: 100 % O2 Device: O2 Device: Not Delivered O2 Flow Rate:   Intake/output summary:  Intake/Output Summary (Last 24 hours) at 05/18/15 1535 Last data filed at 05/18/15 0600  Gross per 24 hour  Intake    975 ml  Output      0 ml  Net    975 ml   LBM:   Baseline Weight: Weight: 48.501 kg (106 lb 14.8 oz) Most recent weight: Weight: 46.5 kg (102 lb 8.2 oz)  Exam Findings:  General: Elderly frail female. Unresponsive. No visible acute distress Resp: No work of breathing observed         Palliative Performance Scale: 10%              Additional Data Reviewed: Recent Labs     05/17/15  1100  05/17/15  1110  WBC  2.6*   --   HGB  11.4*  12.2  PLT  169   --   NA  141  142  BUN  19  24*  CREATININE  1.28*  1.30*     Time In: 1800 Time Out: 1915 Time Total: 75 min Greater than 50%  of this time was spent counseling and coordinating care related to the above assessment and plan.  Signed by: Dory Horn, NP  Dory Horn, NP  05/18/2015, 3:35 PM  Please contact Palliative Medicine Team phone at [phone] for questions and concerns.

## 2015-05-18 NOTE — Progress Notes (Signed)
SLP Cancellation Note  Patient Details Name: Kerry Wells MRN: [id number] DOB: 1920-01-10   Cancelled treatment:       Reason Eval/Treat Not Completed: Fatigue/lethargy limiting ability to participate;Medical issues which prohibited therapy.  Will follow along for readiness.    [name] Laurice 05/18/2015, 11:33 AM

## 2015-05-18 NOTE — Progress Notes (Signed)
OT Cancellation Note  Patient Details Name: Kerry Wells MRN: [id number] DOB: 02-20-1920   Cancelled Treatment:    Reason Eval/Treat Not Completed: Patient not medically ready - Pt currently on bed rest.  Will initiate eval once activity orders increased.   Darlina Rumpf Industry, OTR/L [phone]  05/18/2015, 5:09 PM

## 2015-05-18 NOTE — Progress Notes (Signed)
Initial Nutrition Assessment  Documentation Codes:  Underweight  INTERVENTION:   If TF desired by family recommend: Osmolite 1.2 start at 15 ml/hr and increase by 10 ml every 8 hours to goal rate of 35 ml/hr 30 ml Prostat daily Provides: 1108 kcal, 61 grams protein, and 688 ml H2O.    NUTRITION DIAGNOSIS:   Inadequate oral intake related to inability to eat as evidenced by NPO status.   GOAL:   Patient will meet greater than or equal to 90% of their needs   MONITOR:   I & O's, Vent status, Labs  REASON FOR ASSESSMENT:   Low Braden    ASSESSMENT:   Kerry Wells is an 81 y.o. female with known left thalamic mass followed by [facility]. Patient and family decided to not treat mass in the past. She has known advanced dementia. Patient awoke normal today and at 0830 was noted to have unsteady gait and weakness on the right side. [facility] brought to [facility] as a code stroke. CT head revealed a left thalamic hemorrhage involving the area of previously described mass lesion.  Labs reviewed. Medications reviewed and include: senokot, lasix Pt discussed during ICU rounds and with RN.  Pt unable to participate in swallow eval this am.  MD in with pt/family to discuss plan of care, palliative consult pending.   Diet Order:  Diet NPO time specified  Skin:  Reviewed, no issues  Last BM:  unknown  Height:   Ht Readings from Last 1 Encounters:  05/17/15 5\' 3"  (1.6 m)    Weight:   Wt Readings from Last 1 Encounters:  05/17/15 102 lb 8.2 oz (46.5 kg)    Ideal Body Weight:  52.2 kg  BMI:  Body mass index is 18.16 kg/(m^2).  Estimated Nutritional Needs:   Kcal:  1100-1300  Protein:  55-65 grams  Fluid:  >/= 1.5 L/day  EDUCATION NEEDS:   No education needs identified at this time  [facility], [place], [place] Pager [phone] After Hours Pager

## 2015-05-18 NOTE — Progress Notes (Signed)
STROKE TEAM PROGRESS NOTE   HISTORY Kerry Wells is a 81 y.o. female with known left thalamic mass followed by [facility] Neurology. Patient and family decided to not treat mass in the past. She has known advanced dementia. Patient awoke normal today and at 0830 was noted to have unsteady gait and weakness on the right side. GEMS brought to [facility] as a code stroke. CT head revealed a left thalamic hemorrhage involving the area of previously described mass lesion. tPA was was not administered. BP was well controlled while in ED. NIH stroke score was 11. Patient has been on aspirin, but no anticoagulation.   Date last known well: Date: 05/17/2015 Time last known well: Time: 08:30 tPA Given: No: ICH Modified Rankin: Rankin Score=4   SUBJECTIVE (INTERVAL HISTORY) Her daughter and son are at the bedside.  Overall she feels her condition is gradually worsening. She has remained unresponsive. Blood pressure has been adequately controlled.   OBJECTIVE Temp:  [97.5 F (36.4 C)-99.3 F (37.4 C)] 99 F (37.2 C) (08/12 0800) Pulse Rate:  [41-131] 58 (08/12 0800) Cardiac Rhythm:  [-] Sinus bradycardia (08/12 0700) Resp:  [11-27] 17 (08/12 0800) BP: (101-172)/(41-111) 158/50 mmHg (08/12 0800) SpO2:  [96 %-100 %] 100 % (08/12 0800) Weight:  [46.5 kg (102 lb 8.2 oz)-48.501 kg (106 lb 14.8 oz)] 46.5 kg (102 lb 8.2 oz) (08/11 1315)   Recent Labs Lab 05/17/15 1058 05/17/15 1544 05/17/15 2158  GLUCAP 116* 77 75    Recent Labs Lab 05/17/15 1100 05/17/15 1110  NA 141 142  K 4.0 3.9  CL 110 103  CO2 27  --   GLUCOSE 121* 113*  BUN 19 24*  CREATININE 1.28* 1.30*  CALCIUM 8.7*  --     Recent Labs Lab 05/17/15 1100  AST 26  ALT 25  ALKPHOS 61  BILITOT 1.1  PROT 5.5*  ALBUMIN 3.1*    Recent Labs Lab 05/17/15 1100 05/17/15 1110  WBC 2.6*  --   NEUTROABS 1.3*  --   HGB 11.4* 12.2  HCT 34.4* 36.0  MCV 87.5  --   PLT 169  --    No results for input(s): CKTOTAL, CKMB,  CKMBINDEX, TROPONINI in the last 168 hours.  Recent Labs  05/17/15 1100  LABPROT 15.0  INR 1.17    Recent Labs  05/17/15 1207  COLORURINE YELLOW  LABSPEC 1.011  PHURINE 6.0  GLUCOSEU NEGATIVE  HGBUR NEGATIVE  BILIRUBINUR NEGATIVE  KETONESUR NEGATIVE  PROTEINUR NEGATIVE  UROBILINOGEN 0.2  NITRITE NEGATIVE  LEUKOCYTESUR NEGATIVE       Component Value Date/Time   CHOL 120 04/16/2015 0823   CHOL 168 02/05/2014 0739   TRIG 45 04/16/2015 0823   HDL 64 04/16/2015 0823   HDL 64 02/05/2014 0739   CHOLHDL 1.9 04/16/2015 0823   CHOLHDL 2.6 02/05/2014 0739   VLDL 9 02/05/2014 0739   LDLCALC 47 04/16/2015 0823   LDLCALC 95 02/05/2014 0739   Lab Results  Component Value Date   HGBA1C 5.8* 04/16/2015      Component Value Date/Time   LABOPIA NONE DETECTED 05/17/2015 1207   COCAINSCRNUR NONE DETECTED 05/17/2015 1207   LABBENZ NONE DETECTED 05/17/2015 1207   AMPHETMU NONE DETECTED 05/17/2015 1207   THCU NONE DETECTED 05/17/2015 1207   LABBARB NONE DETECTED 05/17/2015 1207     Recent Labs Lab 05/17/15 1100  ETH <5     IMAGING  Ct Head Wo Contrast 05/17/2015    1. Acute hemorrhage in the left thalamus  has enlarged when compared to the study obtained earlier the same date.  There is greater mass effect. Hemorrhage has now extended into the third ventricle and dependent portions of the lateral ventricles.  The lateral ventricles are larger than they were previously consistent with mild early hydrocephalus.  This is likely obstructive hydrocephalus at the foramen of Monro.  2. No other change.  No evidence of an ischemic infarct.      Ct Head Wo Contrast 05/17/2015    1.5 cm acute hemorrhage in the left thalamus at the site of the previously described mass. Mild surrounding edema with slight midline shift.      Ct Head W Wo Contrast 05/18/2015    1. Slight interval increase in size of left thalamic hemorrhage now measuring 3.0 x 3.5 cm with similar localized  vasogenic edema.  2. Similar intraventricular extension with associated hydrocephalus.  3. 5 mm of localized left-to-right shift at the level of the septum pellucidum.  4. No abnormal enhancement on post-contrast sequences. Additional lesions identified on previous MRI from 02/06/2014 are not definitely visualized on this exam.          PHYSICAL EXAM Frail cachectic looking malnourished elderly African-American lady. . Afebrile. Head is nontraumatic. Neck is supple without bruit.    Cardiac exam no murmur or gallop. Lungs are clear to auscultation. Distal pulses are well felt. Neurological Exam :   Stuporose and unresponsive. Eyes are closed. Not open eyes even with sternal rub. Left gaze preference but eyes would move to the right past midline on doll's eye movements. Pupils small 2 mm irregular sluggishly reactive. Fundi could not be visualized. Will not blink to threat on either side. Right lower facial weakness. Tongue midline. Right hemiplegia with minimum withdrawal in the upper and mild withdrawal in the lower extremity to painful stimuli. Purposeful antigravity movements in the left side. Deep tendon reflexes are brisk bilaterally plantars are both not elicitable. ASSESSMENT/PLAN Kerry Wells is a 81 y.o. female with history of advanced dementia, breast cancer, anemia,  hypertension, hyperlipidemia, and a known left thalamic mass followed by [facility] presenting with unsteady gait and right-sided weakness.. She did not receive IV t-PA due to left thalamic hemorrhage on CT scan.  Stroke:  Dominant - left thalamic hemorrhage with known left thalamic mass.  Resultant right hemiparesis and unsteady gait  MRI  not performed  MRA  not performed  Carotid Doppler not performed this admission  2D Echo not performed this admission  LDL 47  HgbA1c not performed  SCDs for VTE prophylaxis  Diet NPO time specified  aspirin 81 mg orally every day prior to admission, now  on no antithrombotic secondary to hemorrhage.  Ongoing aggressive stroke risk factor management  Therapy recommendations:  Pending  Disposition: Pending  Hypertension  Home meds: Lisinopril  Stable   Hyperlipidemia  Home meds:  Lipitor 10 mg daily resumed in hospital.  LDL 47 in July 2016, goal < 70  Continue statin at discharge    Other Stroke Risk Factors  Advanced age  Known left thalamic mass  Other Active Problems  Anemia with leukopenia  Mild renal insufficiency  Other Pertinent History    Hospital day # 1  I have personally examined this patient, reviewed notes, independently viewed imaging studies, participated in medical decision making and plan of care. I have made any additions or clarifications directly to the above note. Patient has presented with altered mental status and right hemiparesis due to a large  left thalamic hemorrhage into an existing lesion likely a metastasis. Her neurological prognosis is quite poor given her advanced age and prior presence of multiple brain metastasis. I had a long discussion of the bedside with the patient's daughter and other family members and answered questions. The family agrees to DO NOT RESUSCITATE and palliative care consult before making decisions about comfort care. Hence we will not order further neurological testing and continue present management only  This patient is critically ill and at significant risk of neurological worsening, death and care requires constant monitoring of vital signs, hemodynamics,respiratory and cardiac monitoring, extensive review of multiple databases, frequent neurological assessment, discussion with family, other specialists and medical decision making of high complexity.I have made any additions or clarifications directly to the above note.This critical care time does not reflect procedure time, or teaching time or supervisory time of PA/NP/Med Resident etc but could involve care  discussion time.  I spent 30 minutes of neurocritical care time  in the care of  this patient.     Antony Contras, MD Medical Director [facility] Pager: [phone] 05/18/2015 3:23 PM  .    To contact Stroke Continuity provider, please refer to [email or web address] After hours, contact General Neurology

## 2015-05-19 DIAGNOSIS — Z515 Encounter for palliative care: Secondary | ICD-10-CM | POA: Insufficient documentation

## 2015-05-19 DIAGNOSIS — I619 Nontraumatic intracerebral hemorrhage, unspecified: Secondary | ICD-10-CM | POA: Insufficient documentation

## 2015-05-19 NOTE — Progress Notes (Signed)
STROKE TEAM PROGRESS NOTE   HISTORY [name] is a 81 y.o. female with known left thalamic mass followed by [facility]. Patient and family decided to not treat mass in the past. She has known advanced dementia. Patient awoke normal today and at 0830 was noted to have unsteady gait and weakness on the right side. GEMS brought to [facility] as a code stroke. CT head revealed a left thalamic hemorrhage involving the area of previously described mass lesion. tPA was was not administered. BP was well controlled while in ED. NIH stroke score was 11. Patient has been on aspirin, but no anticoagulation.   Date last known well: Date: 05/17/2015 Time last known well: Time: 08:30 tPA Given: No: ICH Modified Rankin: Rankin Score=4   SUBJECTIVE (INTERVAL HISTORY) No changes.    OBJECTIVE Temp:  [98.2 F (36.8 C)-99.1 F (37.3 C)] 98.4 F (36.9 C) (08/13 0515) Pulse Rate:  [51-60] 54 (08/12 2212) Cardiac Rhythm:  [-] Sinus bradycardia (08/12 2130) Resp:  [15-19] 16 (08/13 0515) BP: (140-177)/(47-58) 168/54 mmHg (08/13 0515) SpO2:  [99 %-100 %] 99 % (08/13 0515) Weight:  [47.5 kg (104 lb 11.5 oz)] 47.5 kg (104 lb 11.5 oz) (08/12 2212)   Recent Labs Lab 05/17/15 1058 05/17/15 1544 05/17/15 2158 05/18/15 1149 05/18/15 1636  GLUCAP 116* 77 75 79 85    Recent Labs Lab 05/17/15 1100 05/17/15 1110  NA 141 142  K 4.0 3.9  CL 110 103  CO2 27  --   GLUCOSE 121* 113*  BUN 19 24*  CREATININE 1.28* 1.30*  CALCIUM 8.7*  --     Recent Labs Lab 05/17/15 1100  AST 26  ALT 25  ALKPHOS 61  BILITOT 1.1  PROT 5.5*  ALBUMIN 3.1*    Recent Labs Lab 05/17/15 1100 05/17/15 1110  WBC 2.6*  --   NEUTROABS 1.3*  --   HGB 11.4* 12.2  HCT 34.4* 36.0  MCV 87.5  --   PLT 169  --    No results for input(s): CKTOTAL, CKMB, CKMBINDEX, TROPONINI in the last 168 hours.  Recent Labs  05/17/15 1100  LABPROT 15.0  INR 1.17    Recent Labs  05/17/15 1207  COLORURINE YELLOW   LABSPEC 1.011  PHURINE 6.0  GLUCOSEU NEGATIVE  HGBUR NEGATIVE  BILIRUBINUR NEGATIVE  KETONESUR NEGATIVE  PROTEINUR NEGATIVE  UROBILINOGEN 0.2  NITRITE NEGATIVE  LEUKOCYTESUR NEGATIVE       Component Value Date/Time   CHOL 120 04/16/2015 0823   CHOL 168 02/05/2014 0739   TRIG 45 04/16/2015 0823   HDL 64 04/16/2015 0823   HDL 64 02/05/2014 0739   CHOLHDL 1.9 04/16/2015 0823   CHOLHDL 2.6 02/05/2014 0739   VLDL 9 02/05/2014 0739   LDLCALC 47 04/16/2015 0823   LDLCALC 95 02/05/2014 0739   Lab Results  Component Value Date   HGBA1C 5.8* 04/16/2015      Component Value Date/Time   LABOPIA NONE DETECTED 05/17/2015 1207   COCAINSCRNUR NONE DETECTED 05/17/2015 1207   LABBENZ NONE DETECTED 05/17/2015 1207   AMPHETMU NONE DETECTED 05/17/2015 1207   THCU NONE DETECTED 05/17/2015 1207   LABBARB NONE DETECTED 05/17/2015 1207     Recent Labs Lab 05/17/15 1100  ETH <5     IMAGING  Ct Head Wo Contrast 05/17/2015    1. Acute hemorrhage in the left thalamus has enlarged when compared to the study obtained earlier the same date.  There is greater mass effect. Hemorrhage has now extended into  the third ventricle and dependent portions of the lateral ventricles.  The lateral ventricles are larger than they were previously consistent with mild early hydrocephalus.  This is likely obstructive hydrocephalus at the foramen of Monro.  2. No other change.  No evidence of an ischemic infarct.      Ct Head Wo Contrast 05/17/2015    1.5 cm acute hemorrhage in the left thalamus at the site of the previously described mass. Mild surrounding edema with slight midline shift.      Ct Head W Wo Contrast 05/18/2015    1. Slight interval increase in size of left thalamic hemorrhage now measuring 3.0 x 3.5 cm with similar localized vasogenic edema.  2. Similar intraventricular extension with associated hydrocephalus.  3. 5 mm of localized left-to-right shift at the level of the septum  pellucidum.  4. No abnormal enhancement on post-contrast sequences. Additional lesions identified on previous MRI from 02/06/2014 are not definitely visualized on this exam.          PHYSICAL EXAM Frail cachectic looking malnourished elderly African-American lady. . Afebrile. Head is nontraumatic. Neck is supple without bruit.    Cardiac exam no murmur or gallop. Lungs are clear to auscultation. Distal pulses are well felt. Neurological Exam :   Stuporose and unresponsive. Eyes are closed. Open eyes briefly with sternal rub. Left gaze preference but eyes would move to the right past midline on doll's eye movements. Pupils small 4 mm regular sluggishly reactive. Fundi could not be visualized. Right lower facial weakness. Tongue midline. Right hemiplegia with minimum withdrawal in the upper and mild withdrawal in the lower extremity to painful stimuli. Purposeful antigravity movements in the left side.     ASSESSMENT/PLAN Ms. Kerry Wells is a 81 y.o. female with history of advanced dementia, breast cancer, anemia,  hypertension, hyperlipidemia, and a known left thalamic mass followed by [facility] presenting with unsteady gait and right-sided weakness.. She did not receive IV t-PA due to left thalamic hemorrhage on CT scan.  Stroke:  Dominant - left thalamic hemorrhage with known left thalamic mass.  Resultant right hemiparesis and unsteady gait  MRI  not performed  MRA  not performed  Carotid Doppler not performed this admission  2D Echo not performed this admission  LDL 47  HgbA1c not performed  SCDs for VTE prophylaxis Diet NPO time specified  aspirin 81 mg orally every day prior to admission, now on no antithrombotic secondary to hemorrhage.  Ongoing aggressive stroke risk factor management  Therapy recommendations:  Pending  Disposition: Pending  Hypertension  Home meds: Lisinopril  Stable   Hyperlipidemia  Home meds:  Lipitor 10 mg daily resumed  in hospital.  LDL 47 in July 2016, goal < 70  Continue statin at discharge    Other Stroke Risk Factors  Advanced age  Known left thalamic mass  Other Active Problems  Anemia with leukopenia  Mild renal insufficiency   PLAN  Appreciate palliative care consult. Patient now on 6 N. Comfort care only. DNR  Hospital day # 2    Mikey Bussing PA-C [facility] Pager [phone] 05/19/2015, 10:36 AM    I have personally examined this patient, reviewed notes, independently viewed imaging studies, participated in medical decision making and plan of care. I have made any additions or clarifications directly to the above note. Patient has presented with altered mental status and right hemiparesis due to a large left thalamic hemorrhage into an existing lesion likely a metastasis. Her neurological  prognosis is quite poor given her advanced age and prior presence of multiple brain metastasis.      .    To contact Stroke Continuity provider, please refer to [email or web address] After hours, contact General Neurology

## 2015-05-19 NOTE — Progress Notes (Signed)
Speech Language Pathology Contact Note  ST follow up for possible swallow evaluation.  Note that the patient is for comfort measures and is awaiting an inpatient bed at hospice.  Given this ST to sign off.  If we can be of further assistance please reconsult.  Thank you.    Shelly Flatten, [place] SLP [phone]

## 2015-05-19 NOTE — Progress Notes (Signed)
Daily Progress Note   Patient Name: Kerry Wells       Date: 05/19/2015 DOB: 1920-06-10  Age: 81 y.o. MRN#: [id number] Attending Physician: Garvin Fila, MD Primary Care Physician: Gildardo Cranker, DO Admit Date: 05/17/2015  Reason for Consultation/Follow-up: Establishing goals of care, Non pain symptom management, Pain control and Terminal care  Subjective: Pt is a 81 yo female with new hemorrhagic bleed. She is now comfort care awaiting in-patient bed at hospice. No prn's overnight. She is incontinent. No outward signs of dyspnea or pain. She reacts to touch slightly . She is non-verbal and unable to eat  Interval Events: Made comfort care. Transferred out of ICU Length of Stay: 2 days  Current Medications: Scheduled Meds:     Continuous Infusions:    PRN Meds: [DISCONTINUED] acetaminophen **OR** acetaminophen, glycopyrrolate, LORazepam, morphine injection  Palliative Performance Scale: 10%     Vital Signs: BP 168/54 mmHg  Pulse 54  Temp(Src) 98.4 F (36.9 C) (Oral)  Resp 16  Ht 5\' 3"  (1.6 m)  Wt 47.5 kg (104 lb 11.5 oz)  BMI 18.55 kg/m2  SpO2 99% SpO2: SpO2: 99 % O2 Device: O2 Device: Not Delivered O2 Flow Rate:    Intake/output summary:  Intake/Output Summary (Last 24 hours) at 05/19/15 0946 Last data filed at 05/19/15 0515  Gross per 24 hour  Intake      0 ml  Output      0 ml  Net      0 ml   LBM:   Baseline Weight: Weight: 48.501 kg (106 lb 14.8 oz) Most recent weight: Weight: 47.5 kg (104 lb 11.5 oz)  Physical Exam: General: Frail elderly female. Minimally responsive. No acute distress Cardiac: RRR, rate 48 Lungs: No work of breathing. Clear anteriorally Neuro: Does move to command. Moving right UE and LE spontamneously              Additional Data Reviewed: Recent Labs     05/17/15  1100  05/17/15  1110  WBC  2.6*   --   HGB  11.4*  12.2  PLT  169   --   NA  141  142  BUN  19  24*  CREATININE  1.28*  1.30*     Problem List:    Patient Active Problem List   Diagnosis Date Noted  . Palliative care encounter   . Hemorrhagic stroke   . ICH (intracerebral hemorrhage) 05/17/2015  . Hypothyroidism due to acquired atrophy of thyroid 04/18/2015  . Prediabetes 04/18/2015  . Calculus of gallbladder without cholecystitis without obstruction 04/18/2015  . Right shoulder pain 04/26/2014  . Moderate dementia without behavioral disturbance 04/26/2014  . Bilateral edema of lower extremity 04/26/2014  . Brain mass 02/04/2014  . Fall 02/04/2014  . CVA (cerebral vascular accident) 02/04/2014  . Dementia 03/01/2013  . Routine general medical examination at a health care facility 03/01/2013  . Hypothyroidism 03/01/2013  . Hyperlipidemia 03/01/2013  . Hearing loss 03/01/2013  . Essential hypertension, benign 03/01/2013  . Osteoporosis, unspecified 03/01/2013  . Acute upper respiratory infections of unspecified site 03/01/2013  . GASTRITIS 10/17/2008  . GERD 08/15/2008  . LOSS OF WEIGHT 08/15/2008  . ABDOMINAL PAIN, LEFT LOWER QUADRANT 08/15/2008     Palliative Care Assessment & Plan    Code Status:  DNR  Goals of Care:  Comfort Care  Transfer to in-patient hospice  Symptom Management:  Pain: PRN ms04 available. No PRN's overnight  Dyspnea: No work of breathing  observed. PRN ms04 available  Anxiety/agitation: PRN ativan available   Palliative Prophylaxis:  Incontinence: Insert foley for comfort and skin protection  Psycho-social/Spiritual:  Desire for further Chaplaincy support:no   Prognosis: < 2 weeks Discharge Planning: Hospice facility   Care plan was discussed with RN  Thank you for allowing the Palliative Medicine Team to assist in the care of this patient.   Time In: 0930 Time Out: 0950 Total Time 20 Prolonged Time Billed  no     Greater than 50%  of this time was spent counseling and coordinating care related to the above assessment and plan.   Dory Horn, NP   05/19/2015, 9:46 AM  Please contact Palliative Medicine Team phone at [phone] for questions and concerns.

## 2015-05-20 NOTE — Progress Notes (Signed)
STROKE TEAM PROGRESS NOTE   HISTORY ELLIONA DODDRIDGE is a 81 y.o. female with known left thalamic mass followed by [facility]. Patient and family decided to not treat mass in the past. She has known advanced dementia. Patient awoke normal today and at 0830 was noted to have unsteady gait and weakness on the right side. GEMS brought to [facility] as a code stroke. CT head revealed a left thalamic hemorrhage involving the area of previously described mass lesion. tPA was was not administered. BP was well controlled while in ED. NIH stroke score was 11. Patient has been on aspirin, but no anticoagulation.   Date last known well: Date: 05/17/2015 Time last known well: Time: 08:30 tPA Given: No: ICH Modified Rankin: Rankin Score=4   SUBJECTIVE (INTERVAL HISTORY) No changes.  Daughter reports patient was slightly more alert but remained aphasic last night.  Daughter raised the question about feeding the patient.  We discussed the goals of comfort care and I reassured her that Palliative Care is able to ensure that the patient does not feel discomfort or hunger  OBJECTIVE Temp:  [97.9 F (36.6 C)-98.2 F (36.8 C)] 98.2 F (36.8 C) (08/14 1414) Pulse Rate:  [53-72] 72 (08/14 1414) Cardiac Rhythm:  [-] Sinus bradycardia (08/13 2100) Resp:  [16-17] 16 (08/14 1414) BP: (169-174)/(55-66) 174/66 mmHg (08/14 1414) SpO2:  [99 %-100 %] 100 % (08/14 1414)   Recent Labs Lab 05/17/15 1058 05/17/15 1544 05/17/15 2158 05/18/15 1149 05/18/15 1636  GLUCAP 116* 77 75 79 85    Recent Labs Lab 05/17/15 1100 05/17/15 1110  NA 141 142  K 4.0 3.9  CL 110 103  CO2 27  --   GLUCOSE 121* 113*  BUN 19 24*  CREATININE 1.28* 1.30*  CALCIUM 8.7*  --     Recent Labs Lab 05/17/15 1100  AST 26  ALT 25  ALKPHOS 61  BILITOT 1.1  PROT 5.5*  ALBUMIN 3.1*    Recent Labs Lab 05/17/15 1100 05/17/15 1110  WBC 2.6*  --   NEUTROABS 1.3*  --   HGB 11.4* 12.2  HCT 34.4* 36.0  MCV 87.5  --   PLT  169  --    No results for input(s): CKTOTAL, CKMB, CKMBINDEX, TROPONINI in the last 168 hours. No results for input(s): LABPROT, INR in the last 72 hours. No results for input(s): COLORURINE, LABSPEC, [place], GLUCOSEU, HGBUR, BILIRUBINUR, KETONESUR, PROTEINUR, UROBILINOGEN, NITRITE, LEUKOCYTESUR in the last 72 hours.  Invalid input(s): APPERANCEUR     Component Value Date/Time   CHOL 120 04/16/2015 0823   CHOL 168 02/05/2014 0739   TRIG 45 04/16/2015 0823   HDL 64 04/16/2015 0823   HDL 64 02/05/2014 0739   CHOLHDL 1.9 04/16/2015 0823   CHOLHDL 2.6 02/05/2014 0739   VLDL 9 02/05/2014 0739   LDLCALC 47 04/16/2015 0823   LDLCALC 95 02/05/2014 0739   Lab Results  Component Value Date   HGBA1C 5.8* 04/16/2015      Component Value Date/Time   LABOPIA NONE DETECTED 05/17/2015 1207   COCAINSCRNUR NONE DETECTED 05/17/2015 1207   LABBENZ NONE DETECTED 05/17/2015 1207   AMPHETMU NONE DETECTED 05/17/2015 1207   THCU NONE DETECTED 05/17/2015 1207   LABBARB NONE DETECTED 05/17/2015 1207     Recent Labs Lab 05/17/15 1100  ETH <5     IMAGING  Ct Head Wo Contrast 05/17/2015    1. Acute hemorrhage in the left thalamus has enlarged when compared to the study obtained earlier the same  date.  There is greater mass effect. Hemorrhage has now extended into the third ventricle and dependent portions of the lateral ventricles.  The lateral ventricles are larger than they were previously consistent with mild early hydrocephalus.  This is likely obstructive hydrocephalus at the foramen of Monro.  2. No other change.  No evidence of an ischemic infarct.      Ct Head Wo Contrast 05/17/2015    1.5 cm acute hemorrhage in the left thalamus at the site of the previously described mass. Mild surrounding edema with slight midline shift.    Ct Head W Wo Contrast 05/18/2015    1. Slight interval increase in size of left thalamic hemorrhage now measuring 3.0 x 3.5 cm with similar localized  vasogenic edema.  2. Similar intraventricular extension with associated hydrocephalus.  3. 5 mm of localized left-to-right shift at the level of the septum pellucidum.  4. No abnormal enhancement on post-contrast sequences. Additional lesions identified on previous MRI from 02/06/2014 are not definitely visualized on this exam.     PHYSICAL EXAM Frail cachectic looking malnourished elderly African-American lady. . Afebrile. Head is nontraumatic. Neck is supple without bruit.    Cardiac exam no murmur or gallop. Lungs are clear to auscultation. Distal pulses are well felt. Neurological Exam :  Stuporose and poorly responsive. Eyes are closed. Open eyes briefly with sternal rub. Left gaze preference but eyes would move to the right past midline on doll's eye movements. Pupils small 4 mm regular sluggishly reactive. Difficult to assess facial weakness. Right hemiplegia with minimum withdrawal in the upper and mild withdrawal in the lower extremity to painful stimuli. Withdraws on the left side.   ASSESSMENT/PLAN Ms. FLORIDE HUTMACHER is a 81 y.o. female with history of advanced dementia, breast cancer, anemia,  hypertension, hyperlipidemia, and a known left thalamic mass followed by [facility] presenting with unsteady gait and right-sided weakness.. She did not receive IV t-PA due to left thalamic hemorrhage on CT scan.  Stroke:  Dominant - left thalamic hemorrhage with known left thalamic mass.  Resultant right hemiparesis and unsteady gait  MRI  not performed  MRA  not performed  Carotid Doppler not performed this admission  2D Echo not performed this admission  LDL 47  HgbA1c not performed  SCDs for VTE prophylaxis Diet NPO time specified  PLAN  Appreciate palliative care consult. Patient now on 6 N. Comfort care only. DNR  Hospital day # 3   David L. Rinhuls, PA-C  NEUROLOGY ATTENDING NOTE; I have personally examined this patient, reviewed notes, independently viewed  imaging studies, participated in medical decision making and plan of care. I have made any additions or clarifications directly to the above note. Patient has presented with altered mental status and right hemiparesis due to a large left thalamic hemorrhage into an existing lesion likely a metastasis. Her neurological prognosis is quite poor given her advanced age and prior presence of multiple brain metastasis. Palliative Care team following.  Family may have additional questions regarding comfort care and end-of-life management   Signed:  Dr Elissa Hefty  To contact Stroke Continuity provider, please refer to [email or web address] After hours, contact General Neurology

## 2015-05-21 DIAGNOSIS — I619 Nontraumatic intracerebral hemorrhage, unspecified: Secondary | ICD-10-CM

## 2015-05-21 DIAGNOSIS — G939 Disorder of brain, unspecified: Secondary | ICD-10-CM

## 2015-05-21 DIAGNOSIS — I610 Nontraumatic intracerebral hemorrhage in hemisphere, subcortical: Secondary | ICD-10-CM

## 2015-05-21 DIAGNOSIS — Z515 Encounter for palliative care: Secondary | ICD-10-CM

## 2015-05-21 MED ORDER — SCOPOLAMINE 1 MG/3DAYS TD PT72
1.0000 | MEDICATED_PATCH | TRANSDERMAL | Status: DC
Start: 2015-05-21 — End: 2015-05-23
  Administered 2015-05-21: 1.5 mg via TRANSDERMAL
  Filled 2015-05-21: qty 1

## 2015-05-21 MED ORDER — WHITE PETROLATUM GEL
Status: AC
  Administered 2015-05-21: 0.2
  Filled 2015-05-21: qty 1

## 2015-05-21 NOTE — Progress Notes (Signed)
STROKE TEAM PROGRESS NOTE   HISTORY Kerry Wells is a 81 y.o. female with known left thalamic mass followed by [facility] Neurology. Patient and family decided to not treat mass in the past. She has known advanced dementia. Patient awoke normal today and at 0830 was noted to have unsteady gait and weakness on the right side. GEMS brought to [facility] as a code stroke. CT head revealed a left thalamic hemorrhage involving the area of previously described mass lesion. tPA was was not administered. BP was well controlled while in ED. NIH stroke score was 11. Patient has been on aspirin, but no anticoagulation.   Date last known well: Date: 05/17/2015 Time last known well: Time: 08:30 tPA Given: No: ICH Modified Rankin: Rankin Score=4   SUBJECTIVE (INTERVAL HISTORY) Son at bedside reports patient was just awake from sleep. But remained mildly agitated and aphasic.  Son stated that pt has more secretions in throat and he asked to raise up her head of bed. Will contact social worker for hospice placement.  OBJECTIVE Temp:  [98.3 F (36.8 C)-99.1 F (37.3 C)] 98.4 F (36.9 C) (08/15 1355) Pulse Rate:  [65-66] 66 (08/15 1355) Cardiac Rhythm:  [-]  Resp:  [15-16] 16 (08/15 1355) BP: (152-159)/(61-66) 159/66 mmHg (08/15 1355) SpO2:  [100 %] 100 % (08/15 1355)   Recent Labs Lab 05/17/15 1058 05/17/15 1544 05/17/15 2158 05/18/15 1149 05/18/15 1636  GLUCAP 116* 77 75 79 85    Recent Labs Lab 05/17/15 1100 05/17/15 1110  NA 141 142  K 4.0 3.9  CL 110 103  CO2 27  --   GLUCOSE 121* 113*  BUN 19 24*  CREATININE 1.28* 1.30*  CALCIUM 8.7*  --     Recent Labs Lab 05/17/15 1100  AST 26  ALT 25  ALKPHOS 61  BILITOT 1.1  PROT 5.5*  ALBUMIN 3.1*    Recent Labs Lab 05/17/15 1100 05/17/15 1110  WBC 2.6*  --   NEUTROABS 1.3*  --   HGB 11.4* 12.2  HCT 34.4* 36.0  MCV 87.5  --   PLT 169  --    No results for input(s): CKTOTAL, CKMB, CKMBINDEX, TROPONINI in the last 168  hours. No results for input(s): LABPROT, INR in the last 72 hours. No results for input(s): COLORURINE, LABSPEC, [place], GLUCOSEU, HGBUR, BILIRUBINUR, KETONESUR, PROTEINUR, UROBILINOGEN, NITRITE, LEUKOCYTESUR in the last 72 hours.  Invalid input(s): APPERANCEUR     Component Value Date/Time   CHOL 120 04/16/2015 0823   CHOL 168 02/05/2014 0739   TRIG 45 04/16/2015 0823   HDL 64 04/16/2015 0823   HDL 64 02/05/2014 0739   CHOLHDL 1.9 04/16/2015 0823   CHOLHDL 2.6 02/05/2014 0739   VLDL 9 02/05/2014 0739   LDLCALC 47 04/16/2015 0823   LDLCALC 95 02/05/2014 0739   Lab Results  Component Value Date   HGBA1C 5.8* 04/16/2015      Component Value Date/Time   LABOPIA NONE DETECTED 05/17/2015 1207   COCAINSCRNUR NONE DETECTED 05/17/2015 1207   LABBENZ NONE DETECTED 05/17/2015 1207   AMPHETMU NONE DETECTED 05/17/2015 1207   THCU NONE DETECTED 05/17/2015 1207   LABBARB NONE DETECTED 05/17/2015 1207     Recent Labs Lab 05/17/15 1100  ETH <5     IMAGING  Ct Head Wo Contrast 05/17/2015    1. Acute hemorrhage in the left thalamus has enlarged when compared to the study obtained earlier the same date.  There is greater mass effect. Hemorrhage has now extended into the  third ventricle and dependent portions of the lateral ventricles.  The lateral ventricles are larger than they were previously consistent with mild early hydrocephalus.  This is likely obstructive hydrocephalus at the foramen of Monro.  2. No other change.  No evidence of an ischemic infarct.     Ct Head Wo Contrast 05/17/2015    1.5 cm acute hemorrhage in the left thalamus at the site of the previously described mass. Mild surrounding edema with slight midline shift.    Ct Head W Wo Contrast 05/18/2015    1. Slight interval increase in size of left thalamic hemorrhage now measuring 3.0 x 3.5 cm with similar localized vasogenic edema.  2. Similar intraventricular extension with associated hydrocephalus.  3. 5 mm of  localized left-to-right shift at the level of the septum pellucidum.  4. No abnormal enhancement on post-contrast sequences. Additional lesions identified on previous MRI from 02/06/2014 are not definitely visualized on this exam.    MRI 02/07/15 - 1. Heterogeneous mass like signal abnormality in the left thalamus, overall measures up to 3.6 cm but includes a small 4 mm nodule of enhancement. No significant intracranial mass effect. 2. Subtle abnormal right occipital lobe cortical enhancing lesion measuring 6 mm. 3. Lobulated abnormal enhancing soft tissue at the central planum sphenoidale. A questionable fourth small 5-6 mm dural based lesion along the left posterior convexity. 4. Constellation of findings is most suggestive of metastatic disease to the brain. Less likely, the lesions in #3 might be inconsequential meningiomas.  2D echo 02/2015 -  - Left ventricle: The cavity size was normal. Wall thickness was increased in a pattern of mild LVH. Systolic function was vigorous. The estimated ejection fraction was in the range of 65% to 70%. Wall motion was normal; there were no regional wall motion abnormalities. Left ventricular diastolic function parameters were normal. - Aortic valve: Mild regurgitation. - Atrial septum: No defect or patent foramen ovale was identified. - Tricuspid valve: Moderate regurgitation. - Pulmonary arteries: PA peak pressure: 49mm Hg (S). Impressions: - No cardiac source of emboli was indentified.  CUS 02/2015 - intimal wall thickening CCA. Mild soft plaque origin and proximal ICA. 1-39% ICA stenosis. Vertebral artery flow is antegrade.  PHYSICAL EXAM Frail cachectic looking malnourished elderly African-American lady. . Afebrile. Head is nontraumatic. Neck is supple without bruit.    Cardiac exam no murmur or gallop. Lungs are clear to auscultation. Distal pulses are well felt. Neurological Exam :  Stuporose and poorly responsive. Eyes are  closed. Open eyes briefly with sternal rub. Left gaze preference but eyes would move to the right past midline on doll's eye movements. Pupils small 4 mm regular sluggishly reactive. Difficult to assess facial weakness. Right hemiplegia with minimum withdrawal in the upper and mild withdrawal in the lower extremity to painful stimuli. Withdraws on the left side.   ASSESSMENT/PLAN Kerry Wells is a 81 y.o. female with history of advanced dementia, breast cancer, anemia,  hypertension, hyperlipidemia, and a known left thalamic mass followed by [facility] Neurology presenting with unsteady gait and right-sided weakness.. She did not receive IV t-PA due to left thalamic hemorrhage on CT scan.  ICH:  Dominant - left thalamic hemorrhage with known left thalamic mass.  Resultant right hemiparesis and unsteady gait  MRI in 02/2015 showed multiple brain lesions indicating metastasis  CT head - left thalamic hemorrhage with know left thalamic mass  Carotid Doppler unremarkable in 02/2015  2D Echo unremarkable in 02/2015  LDL 47  HgbA1c 5.8  in 04/2015  Appreciate palliative care consult. On comfort care now. DNR  Social worker to work on residential [facility] day # [place], MD PhD Stroke Neurology 05/21/2015 5:17 PM    To contact Stroke Continuity provider, please refer to [email or web address] After hours, contact General Neurology

## 2015-05-21 NOTE — Care Management Important Message (Signed)
Important Message  Patient Details  Name: Kerry Wells MRN: [id number] Date of Birth: 08/12/1920   Medicare Important Message Given:  Yes-second notification given    [name] 05/21/2015, 3:38 PM

## 2015-05-21 NOTE — Clinical Social Work Note (Addendum)
CSW received consult for residential hospice.  CSW has not seen any family to determine where they would like to go.  CSW will continue to follow patient's discharge planning.    5:15pm CSW met with family who were given list of residential hospice facilities and requested a list of SNFs in case patient and family would rather have patient go to a nursing home.  Patient's family said they were going to review the list of facilities and contact CSW about where they would like her to go.  CSW to continue to follow patient's discharge planning, DNR on patient's chart awaiting signature from physician.  Jones Broom. [place], MSW, [place] 05/21/2015 4:45 PM

## 2015-05-21 NOTE — Progress Notes (Signed)
Nutrition Brief Note  Chart reviewed. Pt now transitioning to comfort care.  No further nutrition interventions warranted at this time.  Please re-consult as needed.   Sherif Millspaugh A. Kihanna Kamiya, RD, LDN, CDE Pager: [phone] After hours Pager: [phone]  

## 2015-05-22 DIAGNOSIS — I618 Other nontraumatic intracerebral hemorrhage: Principal | ICD-10-CM

## 2015-05-22 MED ORDER — GLYCOPYRROLATE 0.2 MG/ML IJ SOLN
0.2000 mg | Freq: Three times a day (TID) | INTRAMUSCULAR | Status: DC
  Administered 2015-05-22 – 2015-05-23 (×4): 0.2 mg via INTRAVENOUS
  Filled 2015-05-22 (×7): qty 1

## 2015-05-22 NOTE — Progress Notes (Signed)
Patient is most appropriate for Hospice Facility not appropriate SNF or rehab. She has had no PO intake since admission therefore her prognosis is likely <2 weeks. Palliative consult outlines goals on 8/13.  Lane Hacker, DO Palliative Medicine

## 2015-05-22 NOTE — Progress Notes (Signed)
Kerry Wells, BSN  Received request from [place], [place] for family interest in [facility] with request for transfer tomorrow morning.  Chart reviewed and received report from PMT.  Met with family to confirm interest and explain services.  Family agreeable to transfer in the morning. CSW aware. Registration paperwork completed.  Dr. Clifton James Monguilod to assume care per family request.  Please fax discharge summary to [phone] when completed.  RN please call report to [place] at [phone].  Please arrange transport for patient to arrive before noon if possible.  Thank you,  Annia Belt RN, [facility] Liaison [phone]

## 2015-05-22 NOTE — Care Management Note (Signed)
Case Management Note  Patient Details  Name: MASSA PE MRN: [id number] Date of Birth: [birth year]  Subjective/Objective:     Family requests residential hospice facility.               Action/Plan: Plan dc to [facility] on 05/23/15, per CSW arrangements.    Expected Discharge Date:     05/23/15             Expected Discharge Plan:  [place]  In-House Referral:  Clinical Social Work  Discharge planning Services  CM Consult  Post Acute Care Choice:    Choice offered to:     DME Arranged:    DME Agency:     HH Arranged:    [place] Agency:     Status of Service:  Completed, signed off  Medicare Important Message Given:  Yes-second notification given Date Medicare IM Given:    Medicare IM give by:    Date Additional Medicare IM Given:    Additional Medicare Important Message give by:     If discussed at [place] of Stay Meetings, dates discussed:    Additional Comments:  Reinaldo Raddle, RN, BSN  Trauma/Neuro ICU Case Manager [phone]

## 2015-05-22 NOTE — Progress Notes (Signed)
STROKE TEAM PROGRESS NOTE   HISTORY Kerry Wells is a 81 y.o. female with known left thalamic mass followed by [facility] Neurology. Patient and family decided to not treat mass in the past. She has known advanced dementia. Patient awoke normal today and at 0830 was noted to have unsteady gait and weakness on the right side. GEMS brought to [facility] as a code stroke. CT head revealed a left thalamic hemorrhage involving the area of previously described mass lesion. tPA was was not administered. BP was well controlled while in ED. NIH stroke score was 11. Patient has been on aspirin, but no anticoagulation.   Date last known well: Date: 05/17/2015 Time last known well: Time: 08:30 tPA Given: No: ICH Modified Rankin: Rankin Score=4   SUBJECTIVE (INTERVAL HISTORY) Son at bedside reports patient was sleeping all the time without discomfort. Social worker is working on Sport and exercise psychologist.  OBJECTIVE Temp:  [98.5 F (36.9 C)-99 F (37.2 C)] 98.5 F (36.9 C) (08/16 1530) Pulse Rate:  [52-87] 60 (08/16 1530) Cardiac Rhythm:  [-]  Resp:  [16] 16 (08/16 1530) BP: (168-187)/(60-82) 168/62 mmHg (08/16 1530) SpO2:  [100 %] 100 % (08/16 1530)   Recent Labs Lab 05/17/15 1058 05/17/15 1544 05/17/15 2158 05/18/15 1149 05/18/15 1636  GLUCAP 116* 77 75 79 85    Recent Labs Lab 05/17/15 1100 05/17/15 1110  NA 141 142  K 4.0 3.9  CL 110 103  CO2 27  --   GLUCOSE 121* 113*  BUN 19 24*  CREATININE 1.28* 1.30*  CALCIUM 8.7*  --     Recent Labs Lab 05/17/15 1100  AST 26  ALT 25  ALKPHOS 61  BILITOT 1.1  PROT 5.5*  ALBUMIN 3.1*    Recent Labs Lab 05/17/15 1100 05/17/15 1110  WBC 2.6*  --   NEUTROABS 1.3*  --   HGB 11.4* 12.2  HCT 34.4* 36.0  MCV 87.5  --   PLT 169  --    No results for input(s): CKTOTAL, CKMB, CKMBINDEX, TROPONINI in the last 168 hours. No results for input(s): LABPROT, INR in the last 72 hours. No results for input(s): COLORURINE, LABSPEC, [place],  GLUCOSEU, HGBUR, BILIRUBINUR, KETONESUR, PROTEINUR, UROBILINOGEN, NITRITE, LEUKOCYTESUR in the last 72 hours.  Invalid input(s): APPERANCEUR     Component Value Date/Time   CHOL 120 04/16/2015 0823   CHOL 168 02/05/2014 0739   TRIG 45 04/16/2015 0823   HDL 64 04/16/2015 0823   HDL 64 02/05/2014 0739   CHOLHDL 1.9 04/16/2015 0823   CHOLHDL 2.6 02/05/2014 0739   VLDL 9 02/05/2014 0739   LDLCALC 47 04/16/2015 0823   LDLCALC 95 02/05/2014 0739   Lab Results  Component Value Date   HGBA1C 5.8* 04/16/2015      Component Value Date/Time   LABOPIA NONE DETECTED 05/17/2015 1207   COCAINSCRNUR NONE DETECTED 05/17/2015 1207   LABBENZ NONE DETECTED 05/17/2015 1207   AMPHETMU NONE DETECTED 05/17/2015 1207   THCU NONE DETECTED 05/17/2015 1207   LABBARB NONE DETECTED 05/17/2015 1207     Recent Labs Lab 05/17/15 1100  ETH <5     IMAGING  Ct Head Wo Contrast 05/17/2015    1. Acute hemorrhage in the left thalamus has enlarged when compared to the study obtained earlier the same date.  There is greater mass effect. Hemorrhage has now extended into the third ventricle and dependent portions of the lateral ventricles.  The lateral ventricles are larger than they were previously consistent with mild early hydrocephalus.  This is likely obstructive hydrocephalus at the foramen of Monro.  2. No other change.  No evidence of an ischemic infarct.     Ct Head Wo Contrast 05/17/2015    1.5 cm acute hemorrhage in the left thalamus at the site of the previously described mass. Mild surrounding edema with slight midline shift.    Ct Head W Wo Contrast 05/18/2015    1. Slight interval increase in size of left thalamic hemorrhage now measuring 3.0 x 3.5 cm with similar localized vasogenic edema.  2. Similar intraventricular extension with associated hydrocephalus.  3. 5 mm of localized left-to-right shift at the level of the septum pellucidum.  4. No abnormal enhancement on post-contrast  sequences. Additional lesions identified on previous MRI from 02/06/2014 are not definitely visualized on this exam.    MRI 02/07/15 - 1. Heterogeneous mass like signal abnormality in the left thalamus, overall measures up to 3.6 cm but includes a small 4 mm nodule of enhancement. No significant intracranial mass effect. 2. Subtle abnormal right occipital lobe cortical enhancing lesion measuring 6 mm. 3. Lobulated abnormal enhancing soft tissue at the central planum sphenoidale. A questionable fourth small 5-6 mm dural based lesion along the left posterior convexity. 4. Constellation of findings is most suggestive of metastatic disease to the brain. Less likely, the lesions in #3 might be inconsequential meningiomas.  2D echo 02/2015 -  - Left ventricle: The cavity size was normal. Wall thickness was increased in a pattern of mild LVH. Systolic function was vigorous. The estimated ejection fraction was in the range of 65% to 70%. Wall motion was normal; there were no regional wall motion abnormalities. Left ventricular diastolic function parameters were normal. - Aortic valve: Mild regurgitation. - Atrial septum: No defect or patent foramen ovale was identified. - Tricuspid valve: Moderate regurgitation. - Pulmonary arteries: PA peak pressure: 27mm Hg (S). Impressions: - No cardiac source of emboli was indentified.  CUS 02/2015 - intimal wall thickening CCA. Mild soft plaque origin and proximal ICA. 1-39% ICA stenosis. Vertebral artery flow is antegrade.  PHYSICAL EXAM - exam limited due to comfort care Frail cachectic looking malnourished elderly African-American lady. Afebrile. Head is nontraumatic. Distal pulses are well felt. Neurological Exam : Exam limited due to comfort care Stuporose and poorly responsive. Eyes are closed. Pupils small 4 mm regular sluggishly reactive. Difficult to assess facial weakness. Right hemiplegia.   ASSESSMENT/PLAN Kerry Wells is  a 81 y.o. female with history of advanced dementia, breast cancer, anemia,  hypertension, hyperlipidemia, and a known left thalamic mass followed by [facility] Neurology presenting with unsteady gait and right-sided weakness.. She did not receive IV t-PA due to left thalamic hemorrhage on CT scan.  ICH:  Dominant - left thalamic hemorrhage with known left thalamic mass.  Resultant right hemiparesis and unsteady gait  MRI in 02/2015 showed multiple brain lesions indicating metastasis  CT head - left thalamic hemorrhage with know left thalamic mass  Carotid Doppler unremarkable in 02/2015  2D Echo unremarkable in 02/2015  LDL 47  HgbA1c 5.8 in 04/2015  Appreciate palliative care consult. On comfort care now. DNR  Social worker to work on residential hospice  Plan to transfer to residential hospice in a.m.  Hospital day # 5   Rosalin Hawking, MD PhD Stroke Neurology 05/22/2015 6:39 PM    To contact Stroke Continuity provider, please refer to [email or web address] After hours, contact General Neurology

## 2015-05-22 NOTE — Clinical Social Work Note (Signed)
CSW received phone call from [place], who confirmed that patient has a bed available tomorrow morning for discharge.  CSW notified bedside nurse to inform physician that patient has a bed available at [facility] for discharge tomorrow.  CSW to continue to follow patient's progress and arrange transportation to [facility] in the morning.  Jones Broom. [place], MSW, [place] 05/22/2015 4:43 PM

## 2015-05-22 NOTE — Clinical Social Work Note (Signed)
CSW received phone call from patient's daughter Syrena Burges, who is requesting [facility], [place] notified [facility] social worker to review patient's record.  CSW to continue to follow patient's discharge planning, awaiting decision by [facility].  Jones Broom. Jameis [place], MSW, [place] 05/22/2015 11:18 AM

## 2015-05-23 MED ORDER — MORPHINE SULFATE 2 MG/ML IJ SOLN: 1.0000 mg | INTRAMUSCULAR | Status: AC | PRN

## 2015-05-23 MED ORDER — GLYCOPYRROLATE 0.2 MG/ML IJ SOLN: 0.2000 mg | INTRAMUSCULAR | Status: AC | PRN

## 2015-05-23 MED ORDER — SCOPOLAMINE 1 MG/3DAYS TD PT72: 1.0000 | MEDICATED_PATCH | TRANSDERMAL | Status: AC

## 2015-05-23 MED ORDER — GLYCOPYRROLATE 0.2 MG/ML IJ SOLN: 0.2000 mg | Freq: Three times a day (TID) | INTRAMUSCULAR | Status: AC

## 2015-05-23 MED ORDER — LORAZEPAM 2 MG/ML IJ SOLN: 0.5000 mg | INTRAMUSCULAR | Status: AC | PRN

## 2015-05-23 MED ORDER — ACETAMINOPHEN 650 MG RE SUPP: 650.0000 mg | RECTAL | Status: AC | PRN

## 2015-05-23 NOTE — Clinical Social Work Note (Signed)
Patient to be d/c'ed today to [facility].  Patient and family agreeable to plans will transport via ems RN to call report.  CSW notified patient's son Nesiah Jump and left a message on voice mail, CSW then attempted to contact patient's daughter Shirlean Mylar, but was unable to leave a message on phone.  CSW spoke with patient's family yesterday who were at bedside and they are in agreement to going to [facility] this morning.  Evette Cristal, MSW, [place]

## 2015-05-23 NOTE — Clinical Social Work Note (Signed)
Clinical Social Work Assessment  Patient Details  Name: Kerry Wells MRN: [id number] Date of Birth: 04/06/1920  Date of referral:  05/22/15               Reason for consult:  Facility Placement, End of Life/Hospice                Permission sought to share information with:  Family Supports Permission granted to share information::   (Patient has dementia, family is main contact.)  Name::     Shirlean Mylar daughter  Agency::  [facility] admissions  Relationship::     Contact Information:     Housing/Transportation Living arrangements for the past 2 months:  Single Family Home Source of Information:  Palliative Care Team, Adult Children Patient Interpreter Needed:  None Criminal Activity/Legal Involvement Pertinent to Current Situation/Hospitalization:  No - Comment as needed Significant Relationships:  Adult Children Lives with:  Adult Children Do you feel safe going back to the place where you live?  No (Patient is end of life palliative recommended residential hospice.) Need for family participation in patient care:  Yes (Comment)  Care giving concerns: Patient suffered a stroke, and is now on residential hospice.   Social Worker assessment / plan:  Patient is a 81 year old female who is only oriented to person.  Patient's family have spoken with palliative and recommendation is residential hospice.  CSW spoke with the patient's family and they would like her to go to [facility] for residential hospice.  Patient's family are involved in patient's care, and visit her in hospital frequently.  CSW explained hospice placement to patient's family and they expressed they did not have any other concerns or questions.  Employment status:  Retired Forensic scientist:  Medicare PT Recommendations:  Not assessed at this time [place] / Referral to community resources:     Patient/Family's Response to care: Patient's family in agreement to going to [facility] residential  hospice.  Patient/Family's Understanding of and Emotional Response to Diagnosis, Current Treatment, and Prognosis:  Patient's family aware of poor prognosis.  Emotional Assessment Appearance:  Appears stated age Attitude/Demeanor/Rapport:    Affect (typically observed):  Stable, Calm, Quiet Orientation:  Oriented to Self Alcohol / Substance use:  Not Applicable Psych involvement (Current and /or in the community):  No (Comment)  Discharge Needs  Concerns to be addressed:  No discharge needs identified Readmission within the last 30 days:  No Current discharge risk:  None Barriers to Discharge:  No Barriers Identified   Ross Ludwig, LCSWA 05/23/2015, 10:52 AM

## 2015-05-23 NOTE — Progress Notes (Signed)
Report called to Arlana Hove, RN at [facility]. Await transport by ambulance.

## 2015-05-23 NOTE — Discharge Summary (Signed)
Stroke Discharge Summary  Patient ID: Kerry Wells   MRN: [id number]      DOB: 10/06/1920  Date of Admission: 05/17/2015 Date of Discharge: 05/23/2015  Attending Physician:  Rosalin Hawking, MD, Stroke MD  Consulting Physician(s):   Treatment Team:  Palliative [facility] pulmonary/intensive care  Patient's PCP:  Gildardo Cranker, DO  DISCHARGE DIAGNOSIS:  Active Problems:   ICH (intracerebral hemorrhage) - left thalamus   Brain mass multifocal likely metastasis   Palliative care encounter   Hemorrhagic stroke    BMI: Body mass index is 18.55 kg/(m^2).  Past Medical History  Diagnosis Date  . Breast cancer   . Hypertension   . Hyperlipidemia   . GERD (gastroesophageal reflux disease)   . Diverticulosis   . Cholelithiasis   . Gout   . Osteoporosis   . Anemia   . Depression   . Hemorrhoids   . Insomnia   . Hypothyroidism   . Hiatal hernia   . Breast cancer 2000  . Chronic constipation   . Unspecified urinary incontinence   . Edema   . Encounter for long-term (current) use of other medications   . Unspecified venous (peripheral) insufficiency   . Nonspecific abnormal results of liver function study   . Gout, unspecified   . Memory loss   . Disorders of bilirubin excretion   . Neutropenia, unspecified   . Anemia, unspecified   . Other specified cardiac dysrhythmias(427.89)   . Hypopotassemia   . Disorder of bone and cartilage, unspecified   . Postmastectomy lymphedema syndrome   . Diverticulitis of colon (without mention of hemorrhage)    Past Surgical History  Procedure Laterality Date  . Mastectomy  2008    left  . Vaginal hysterectomy    . Fibroid tumor removal    . Abdominal hysterectomy        Medication List    ASK your doctor about these medications        aspirin 81 MG tablet  Take 81 mg by mouth daily.     atorvastatin 10 MG tablet  Commonly known as:  LIPITOR  TAKE ONE TABLET BY MOUTH ONCE DAILY TO  LOWER  CHOLESTEROL     furosemide 20  MG tablet  Commonly known as:  LASIX  Take 1 tablet (20 mg total) by mouth daily.     levothyroxine 25 MCG tablet  Commonly known as:  SYNTHROID, LEVOTHROID  Take 1/2 tablet by mouth daily before breakfast.     lisinopril 40 MG tablet  Commonly known as:  PRINIVIL,ZESTRIL  Take 1 tablet (40 mg total) by mouth daily.     multivitamin tablet  Take 1 tablet by mouth daily.     pantoprazole 40 MG tablet  Commonly known as:  PROTONIX  Take 1 tablet (40 mg total) by mouth daily.     POTASSIUM PO  Take 1 tablet by mouth daily. "over the counter potassium supplement"        LABORATORY STUDIES CBC    Component Value Date/Time   WBC 2.6* 05/17/2015 1100   WBC 2.7* 02/24/2013 1017   WBC 3.6* 10/26/2009 1408   RBC 3.93 05/17/2015 1100   RBC 4.52 02/24/2013 1017   RBC 4.82 10/26/2009 1408   HGB 12.2 05/17/2015 1110   HGB 13.1 10/26/2009 1408   HCT 36.0 05/17/2015 1110   HCT 40.6 10/26/2009 1408   PLT 169 05/17/2015 1100   PLT 205 10/26/2009 1408   MCV 87.5 05/17/2015 1100  MCV 84.2 10/26/2009 1408   MCH 29.0 05/17/2015 1100   MCH 27.9 02/24/2013 1017   MCH 29.1 10/30/2008 0959   MCHC 33.1 05/17/2015 1100   MCHC 33.2 02/24/2013 1017   MCHC 32.3 10/26/2009 1408   RDW 15.4 05/17/2015 1100   RDW 16.2* 02/24/2013 1017   RDW 15.2* 10/26/2009 1408   LYMPHSABS 0.9 05/17/2015 1100   LYMPHSABS 1.0 02/24/2013 1017   LYMPHSABS 1.2 10/26/2009 1408   MONOABS 0.4 05/17/2015 1100   MONOABS 0.5 10/26/2009 1408   EOSABS 0.1 05/17/2015 1100   EOSABS 0.1 02/24/2013 1017   BASOSABS 0.0 05/17/2015 1100   BASOSABS 0.0 02/24/2013 1017   BASOSABS 0.0 10/26/2009 1408   CMP    Component Value Date/Time   NA 142 05/17/2015 1110   NA 143 04/16/2015 0823   K 3.9 05/17/2015 1110   CL 103 05/17/2015 1110   CO2 27 05/17/2015 1100   GLUCOSE 113* 05/17/2015 1110   GLUCOSE 77 04/16/2015 0823   BUN 24* 05/17/2015 1110   BUN 17 04/16/2015 0823   CREATININE 1.30* 05/17/2015 1110   CALCIUM  8.7* 05/17/2015 1100   PROT 5.5* 05/17/2015 1100   PROT 5.4* 04/16/2015 0823   ALBUMIN 3.1* 05/17/2015 1100   AST 26 05/17/2015 1100   ALT 25 05/17/2015 1100   ALKPHOS 61 05/17/2015 1100   BILITOT 1.1 05/17/2015 1100   BILITOT 1.3* 04/16/2015 0823   GFRNONAA 35* 05/17/2015 1100   GFRAA 41* 05/17/2015 1100   COAGS Lab Results  Component Value Date   INR 1.17 05/17/2015   INR 1.0 03/14/2009   Lipid Panel    Component Value Date/Time   CHOL 120 04/16/2015 0823   CHOL 168 02/05/2014 0739   TRIG 45 04/16/2015 0823   HDL 64 04/16/2015 0823   HDL 64 02/05/2014 0739   CHOLHDL 1.9 04/16/2015 0823   CHOLHDL 2.6 02/05/2014 0739   VLDL 9 02/05/2014 0739   LDLCALC 47 04/16/2015 0823   LDLCALC 95 02/05/2014 0739   HgbA1C  Lab Results  Component Value Date   HGBA1C 5.8* 04/16/2015   Cardiac Panel (last 3 results) No results for input(s): CKTOTAL, CKMB, TROPONINI, RELINDX in the last 72 hours. Urinalysis    Component Value Date/Time   COLORURINE YELLOW 05/17/2015 [place] 05/17/2015 1207   LABSPEC 1.011 05/17/2015 1207   PHURINE 6.0 05/17/2015 1207   GLUCOSEU NEGATIVE 05/17/2015 1207   HGBUR NEGATIVE 05/17/2015 1207   BILIRUBINUR NEGATIVE 05/17/2015 1207   KETONESUR NEGATIVE 05/17/2015 1207   PROTEINUR NEGATIVE 05/17/2015 1207   UROBILINOGEN 0.2 05/17/2015 1207   NITRITE NEGATIVE 05/17/2015 1207   LEUKOCYTESUR NEGATIVE 05/17/2015 1207   Urine Drug Screen     Component Value Date/Time   LABOPIA NONE DETECTED 05/17/2015 [place] DETECTED 05/17/2015 1207   LABBENZ NONE DETECTED 05/17/2015 1207   AMPHETMU NONE DETECTED 05/17/2015 1207   THCU NONE DETECTED 05/17/2015 1207   LABBARB NONE DETECTED 05/17/2015 1207    Alcohol Level    Component Value Date/Time   ETH <5 05/17/2015 1100     SIGNIFICANT DIAGNOSTIC STUDIES Ct Head Wo Contrast 05/17/2015  1. Acute hemorrhage in the left thalamus has enlarged when compared to the study  obtained earlier the same date.  There is greater mass effect. Hemorrhage has now extended into the third ventricle and dependent portions of the lateral ventricles.  The lateral ventricles are larger than they were previously consistent with mild early hydrocephalus.  This is  likely obstructive hydrocephalus at the foramen of [place].  2. No other change. No evidence of an ischemic infarct.   Ct Head Wo Contrast 05/17/2015  1.5 cm acute hemorrhage in the left thalamus at the site of the previously described mass. Mild surrounding edema with slight midline shift.   Ct Head W Wo Contrast 05/18/2015  1. Slight interval increase in size of left thalamic hemorrhage now measuring 3.0 x 3.5 cm with similar localized vasogenic edema.  2. Similar intraventricular extension with associated hydrocephalus.  3. 5 mm of localized left-to-right shift at the level of the septum pellucidum.  4. No abnormal enhancement on post-contrast sequences. Additional lesions identified on previous MRI from 02/06/2014 are not definitely visualized on this exam.   MRI 02/07/15 - 1. Heterogeneous mass like signal abnormality in the left thalamus, overall measures up to 3.6 cm but includes a small 4 mm nodule of enhancement. No significant intracranial mass effect. 2. Subtle abnormal right occipital lobe cortical enhancing lesion measuring 6 mm. 3. Lobulated abnormal enhancing soft tissue at the central planum sphenoidale. A questionable fourth small 5-6 mm dural based lesion along the left posterior convexity. 4. Constellation of findings is most suggestive of metastatic disease to the brain. Less likely, the lesions in #3 might be inconsequential meningiomas.  2D echo 02/2015 - - Left ventricle: The cavity size was normal. Wall thickness was increased in a pattern of mild LVH. Systolic function was vigorous. The estimated ejection fraction was in the range of 65% to 70%. Wall motion was normal;  there were no regional wall motion abnormalities. Left ventricular diastolic function parameters were normal. - Aortic valve: Mild regurgitation. - Atrial septum: No defect or patent foramen ovale was identified. - Tricuspid valve: Moderate regurgitation. - Pulmonary arteries: PA peak pressure: 55mm Hg (S). Impressions: - No cardiac source of emboli was indentified.  CUS 02/2015 - intimal wall thickening CCA. Mild soft plaque origin and proximal ICA. 1-39% ICA stenosis. Vertebral artery flow is antegrade.     HISTORY OF PRESENT ILLNESS Kerry Wells is a 81 y.o. female with known left thalamic mass followed by [facility] Neurology. Patient and family decided to not treat mass in the past. She has known advanced dementia. Patient awoke normal today and at 0830 was noted to have unsteady gait and weakness on the right side. [facility] brought [facility] as a code stroke. CT head revealed a left thalamic hemorrhage involving the area of previously described mass lesion. tPA was was not administered. BP was well controlled while in ED. NIH stroke score was 11. Patient has been on aspirin, but no anticoagulation.   Date last known well: Date: 05/17/2015 Time last known well: Time: 08:30 tPA Given: No: ICH Modified Rankin: Rankin Score=4   HOSPITAL COURSE Pt was admitted to neuro ICU initially for left thalamic bleeding. MRI showed multifocal intracranial lesions with enhancement suggesting metastasis to the brain. Left thalamic bleeding on the left thalamic lesion likely tumor bleeding. Discussed with family about her prognosis and they are agree with palliative care / comfort care per pt wishes. She was then put on comfort care after palliative care consultation and transferred to 6N. Currently she is planned to transfer to residential hospice when bed available.   ICH: Dominant - left thalamic hemorrhage with known left thalamic mass.  Resultant right hemiparesis and unsteady gait  MRI in  02/2015 showed multiple brain lesions indicating metastasis  CT head - left thalamic hemorrhage with know left thalamic mass  Carotid  Doppler unremarkable in 02/2015  2D Echo unremarkable in 02/2015  LDL 47  HgbA1c 5.8 in 04/2015  Appreciate palliative care consult. On comfort care now. DNR  Social worker to work on residential hospice  Plan to transfer to residential hospice today  [place] Blood pressure 164/64, pulse 66, temperature 97.9 F (36.6 C), temperature source Axillary, resp. rate 17, height 5\' 3"  (1.6 m), weight 104 lb 11.5 oz (47.5 kg), SpO2 100 %.  PHYSICAL EXAM - exam limited due to comfort care Frail cachectic looking malnourished elderly African-American lady. Afebrile. Head is nontraumatic. Distal pulses are well felt. Neurological Exam : Exam limited due to comfort care Stuporose and poorly responsive. Eyes are closed. Pupils small 4 mm regular sluggishly reactive. Difficult to assess facial weakness. Right hemiplegia.  Discharge Diet   Diet NPO time specified   DISCHARGE PLAN  Disposition:  Residential hospice   25 minutes were spent preparing discharge.  Rosalin Hawking, MD PhD Stroke Neurology 05/23/2015 9:18 AM

## 2015-06-07 DEATH — deceased

## 2015-07-20 ENCOUNTER — Ambulatory Visit: Payer: Medicare Other | Admitting: Internal Medicine

## 2016-05-29 IMAGING — CT CT ABD-PELV W/ CM
2 of 5 series · 16 of 46 positions shown, 18 images · IV contrast (Omni 300)
Comparison: 02/07/2014

CLINICAL DATA: LEFT side and LEFT flank pain beginning on
11/19/2014, LEFT lower quadrant abdominal pain, history of breast
cancer, hypertension, GERD, cholelithiasis, hiatal hernia, stroke

EXAM:
CT ABDOMEN AND PELVIS WITH CONTRAST
TECHNIQUE: Multidetector CT imaging of the abdomen and pelvis was performed
using the standard protocol following bolus administration of
intravenous contrast. Sagittal and coronal MPR images reconstructed
from axial data set.
CONTRAST:  100mL OMNIPAQUE IOHEXOL 300 MG/ML SOLN IV. Dilute oral
contrast.

[Series 2: abd/ pelvis 5.0 i30f 1 · axial · 0.70mm/px · z∈[+791,+1166]mm · 13 of 85 slices shown, 15 images]
[im 5/85  soft-tissue]
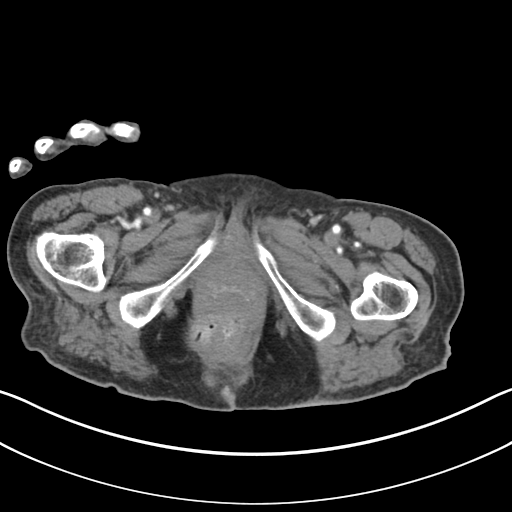
[im 5/85  bone]
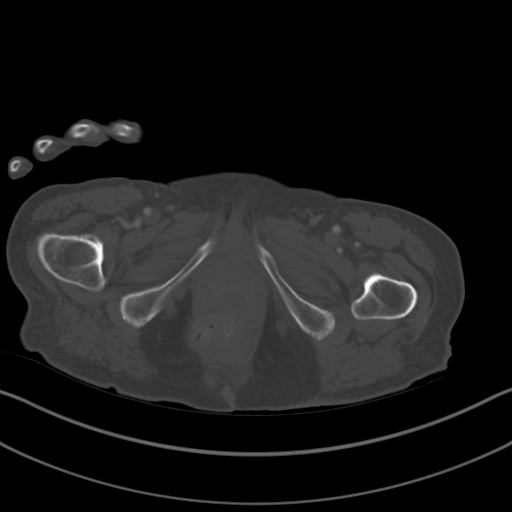
[im 13/85  soft-tissue]
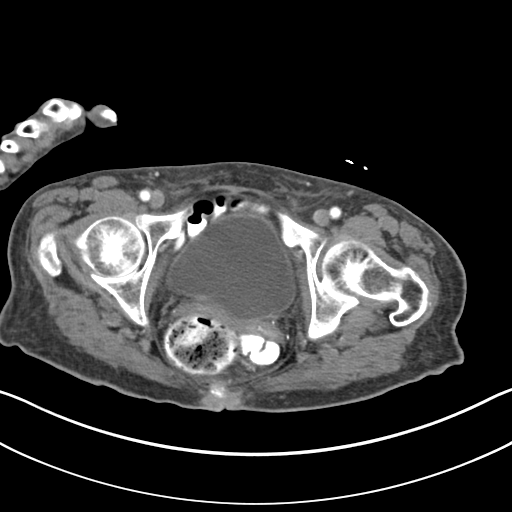
[im 17/85  soft-tissue]
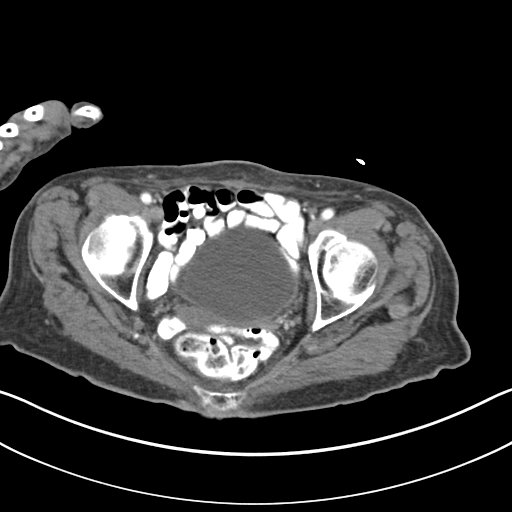
[im 26/85  soft-tissue]
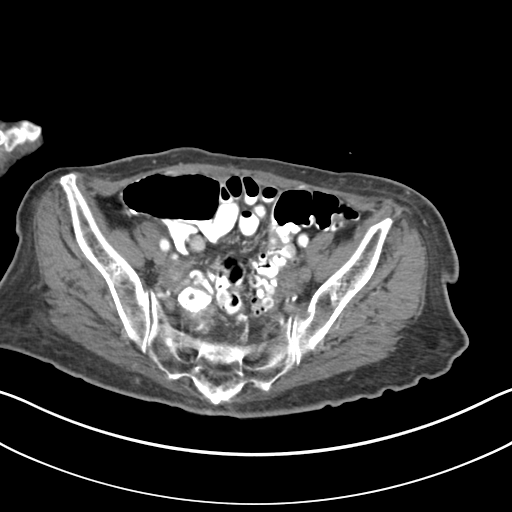
[im 30/85  soft-tissue]
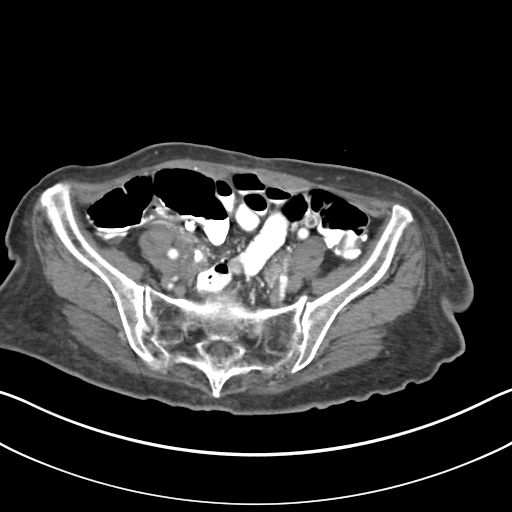
[im 38/85  soft-tissue]
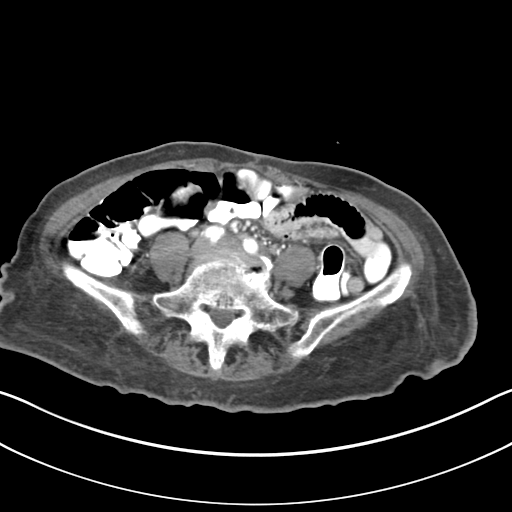
[im 43/85  soft-tissue]
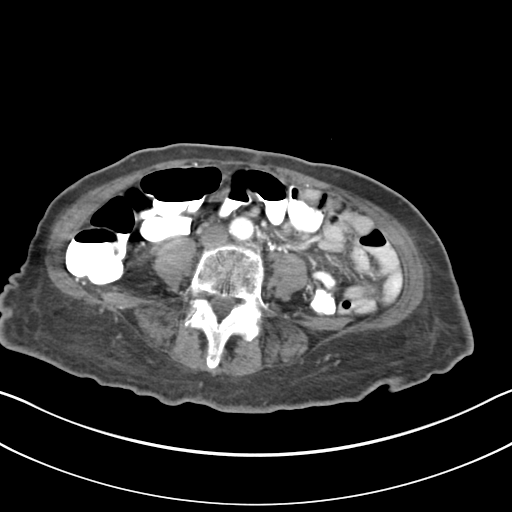
[im 47/85  soft-tissue]
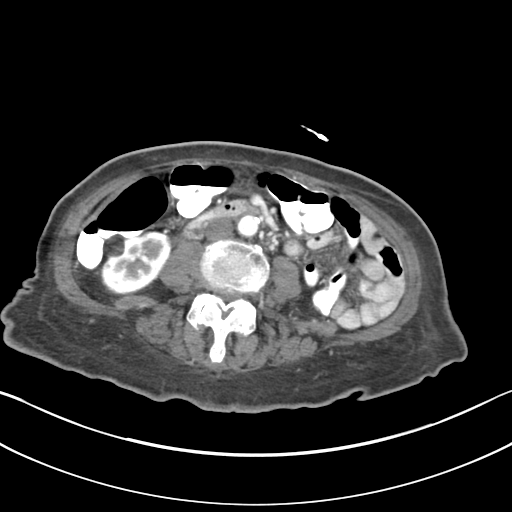
[im 55/85  soft-tissue]
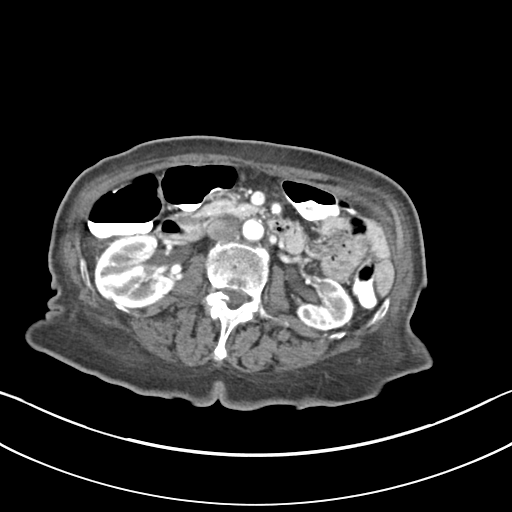
[im 55/85  bone]
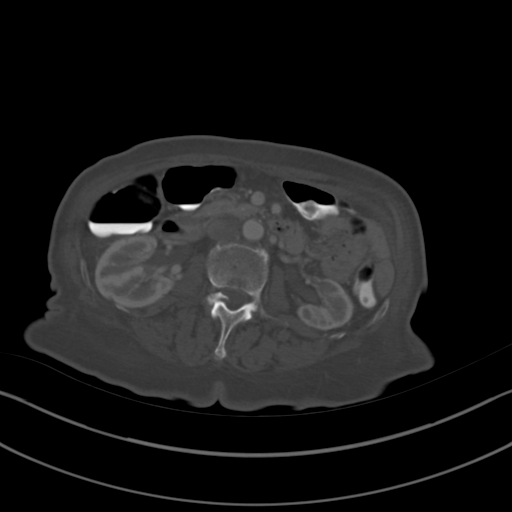
[im 59/85  soft-tissue]
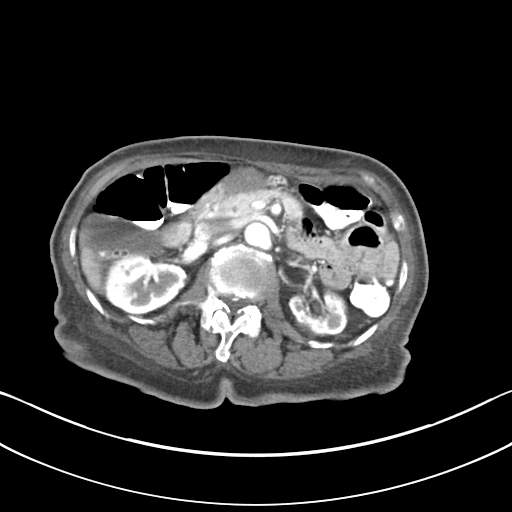
[im 68/85  soft-tissue]
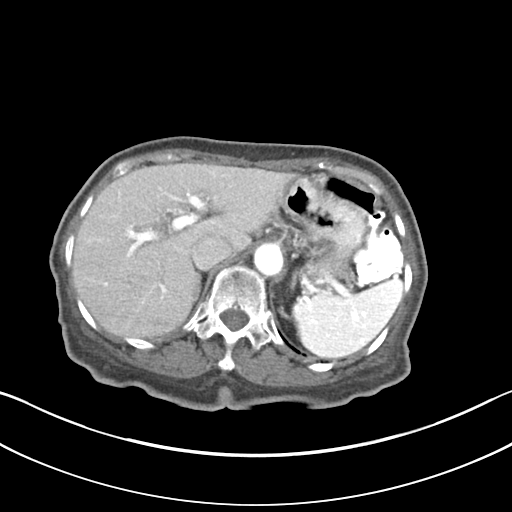
[im 72/85  soft-tissue]
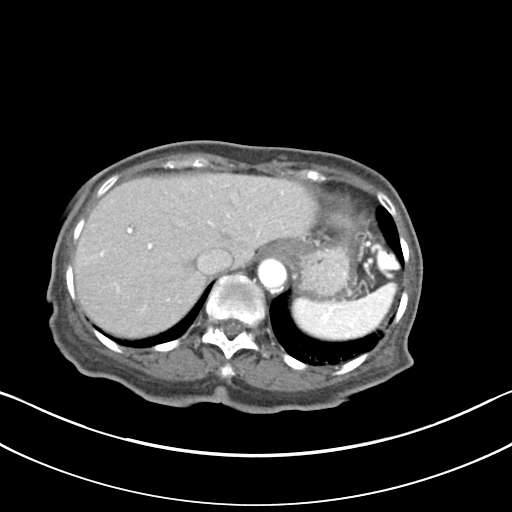
[im 80/85  soft-tissue]
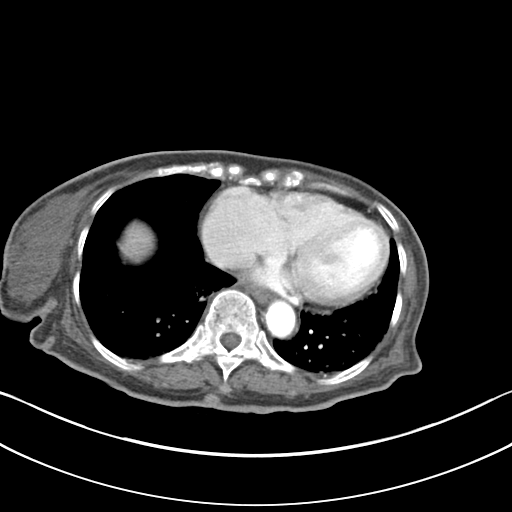

[Series 5: coronals · coronal · 0.70mm/px · 3 of 90 slices shown]
[im 30/90  soft-tissue]
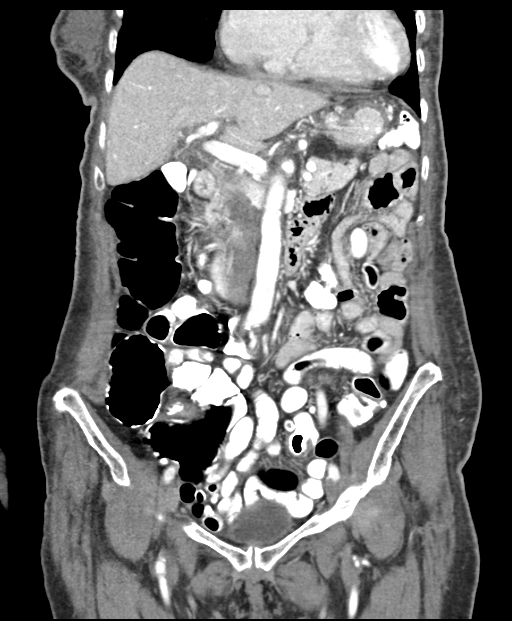
[im 40/90  soft-tissue]
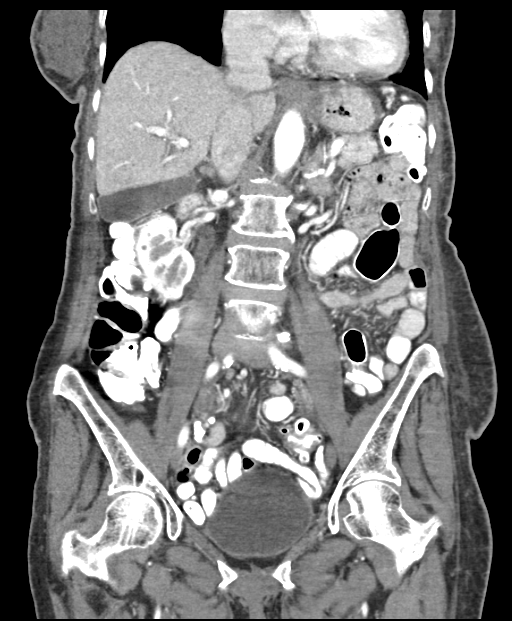
[im 50/90  soft-tissue]
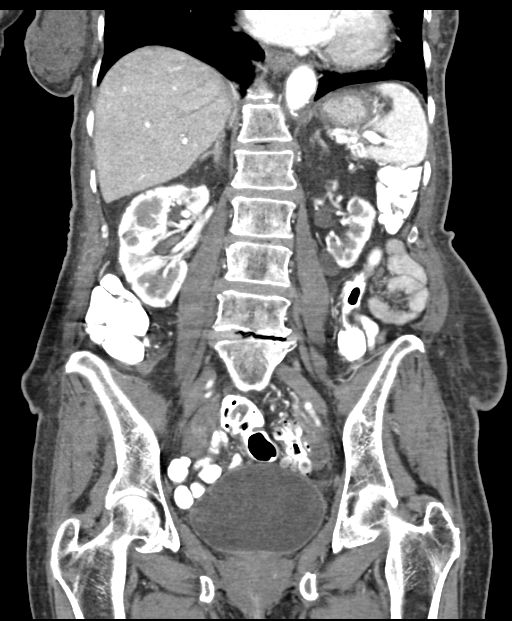

[16 of 46 positions shown; findings below may reference images not displayed]

FINDINGS: Bibasilar atelectasis.

Calcified granulomata within liver and spleen.

Multiple BILATERAL renal cysts with additional which are
intermediate in attenuation.

Cortical atrophy of LEFT kidney.

Slightly irregular cyst RIGHT lobe liver posteriorly 20 x 18 mm,
previously 19 x 18 mm.

Dependent gallstones in gallbladder.

Remainder of liver, spleen, pancreas, and kidneys unremarkable.

Thickened adrenal glands bilaterally slightly greater on LEFT
without discrete mass.

Stomach decompressed and suboptimally assessed.

Extensive sigmoid diverticulosis without definite evidence of
diverticulitis.

Uterus surgically absent grossly unremarkable ovaries.

Remaining bowel loops, appendix, bladder and ureters normal
appearance.

No definite mass, adenopathy, free fluid, or free air.

Osseous demineralization with scattered degenerative changes of the
lumbar spine.

Scattered atherosclerotic calcifications without aortic aneurysm.
IMPRESSION: Cholelithiasis.

Sigmoid diverticulosis.

Hepatic and BILATERAL renal cysts with additional lesions in both
kidneys which are of intermediate attenuation and grossly unchanged
question complicated cysts.

No definite acute intra-abdominal or intrapelvic abnormalities.

## 2018-05-26 ENCOUNTER — Encounter: Payer: Self-pay | Admitting: Internal Medicine
